# Patient Record
Sex: Male | Born: 1955 | Race: White | Hispanic: No | Marital: Single | Smoking: Former smoker
Health system: Southern US, Community
[De-identification: ages and names within clinical notes are randomized; demographics above are authoritative.]

## PROBLEM LIST (undated history)

## (undated) DIAGNOSIS — D239 Other benign neoplasm of skin, unspecified: Secondary | ICD-10-CM

## (undated) DIAGNOSIS — C4491 Basal cell carcinoma of skin, unspecified: Secondary | ICD-10-CM

## (undated) DIAGNOSIS — F419 Anxiety disorder, unspecified: Secondary | ICD-10-CM

## (undated) DIAGNOSIS — B1910 Unspecified viral hepatitis B without hepatic coma: Secondary | ICD-10-CM

## (undated) DIAGNOSIS — N189 Chronic kidney disease, unspecified: Secondary | ICD-10-CM

## (undated) DIAGNOSIS — F32A Depression, unspecified: Secondary | ICD-10-CM

## (undated) HISTORY — DX: Unspecified viral hepatitis B without hepatic coma: B19.10

## (undated) HISTORY — PX: COLON SURGERY: SHX602

## (undated) HISTORY — DX: Other benign neoplasm of skin, unspecified: D23.9

## (undated) HISTORY — DX: Anxiety disorder, unspecified: F41.9

## (undated) HISTORY — DX: Chronic kidney disease, unspecified: N18.9

## (undated) HISTORY — DX: Depression, unspecified: F32.A

## (undated) HISTORY — PX: SKIN BIOPSY: SHX1

---
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
## 2009-08-27 ENCOUNTER — Ambulatory Visit: Payer: Self-pay | Admitting: Gastroenterology

## 2009-08-27 DIAGNOSIS — R1011 Right upper quadrant pain: Secondary | ICD-10-CM | POA: Insufficient documentation

## 2009-08-27 DIAGNOSIS — B1910 Unspecified viral hepatitis B without hepatic coma: Secondary | ICD-10-CM

## 2009-08-27 DIAGNOSIS — R5383 Other fatigue: Secondary | ICD-10-CM

## 2009-08-27 DIAGNOSIS — R5381 Other malaise: Secondary | ICD-10-CM

## 2009-08-28 ENCOUNTER — Encounter: Payer: Self-pay | Admitting: Gastroenterology

## 2009-08-28 ENCOUNTER — Encounter: Payer: Self-pay | Admitting: Internal Medicine

## 2009-08-29 ENCOUNTER — Encounter: Payer: Self-pay | Admitting: Gastroenterology

## 2009-09-02 ENCOUNTER — Ambulatory Visit (HOSPITAL_COMMUNITY): Admission: RE | Admit: 2009-09-02 | Discharge: 2009-09-02 | Payer: Self-pay | Admitting: Internal Medicine

## 2009-09-15 ENCOUNTER — Encounter: Payer: Self-pay | Admitting: Gastroenterology

## 2009-09-18 LAB — CONVERTED CEMR LAB
AST: 13 units/L (ref 0–37)
Alkaline Phosphatase: 61 units/L (ref 39–117)
Bilirubin, Direct: 0.2 mg/dL (ref 0.0–0.3)
Eosinophils Absolute: 0.2 10*3/uL (ref 0.0–0.7)
HCT: 44.4 % (ref 39.0–52.0)
Hemoglobin: 15.4 g/dL (ref 13.0–17.0)
Hep B E Ag: NEGATIVE
Hepatitis B DNA: <29 IU/mL (ref <29)
Lymphocytes Relative: 33 % (ref 12–46)
Lymphs Abs: 2.3 10*3/uL (ref 0.7–4.0)
MCV: 95.5 fL (ref 78.0–100.0)
RBC: 4.65 M/uL (ref 4.22–5.81)
RDW: 14 % (ref 11.5–15.5)
TSH: 1.247 microintl units/mL (ref 0.350–4.500)
WBC: 7 10*3/uL (ref 4.0–10.5)

## 2009-09-22 ENCOUNTER — Encounter: Payer: Self-pay | Admitting: Internal Medicine

## 2009-09-24 ENCOUNTER — Encounter: Payer: Self-pay | Admitting: Gastroenterology

## 2009-10-02 ENCOUNTER — Encounter: Payer: Self-pay | Admitting: Gastroenterology

## 2009-10-09 ENCOUNTER — Ambulatory Visit: Payer: Self-pay | Admitting: Gastroenterology

## 2009-10-30 ENCOUNTER — Ambulatory Visit (HOSPITAL_COMMUNITY): Admission: RE | Admit: 2009-10-30 | Discharge: 2009-10-30 | Payer: Self-pay | Admitting: Gastroenterology

## 2009-11-06 ENCOUNTER — Ambulatory Visit: Payer: Self-pay | Admitting: Gastroenterology

## 2010-01-27 ENCOUNTER — Encounter: Payer: Self-pay | Admitting: Gastroenterology

## 2010-03-19 ENCOUNTER — Encounter (INDEPENDENT_AMBULATORY_CARE_PROVIDER_SITE_OTHER): Payer: Self-pay | Admitting: *Deleted

## 2010-03-31 ENCOUNTER — Encounter: Payer: Self-pay | Admitting: Internal Medicine

## 2010-04-06 ENCOUNTER — Ambulatory Visit (HOSPITAL_COMMUNITY): Admission: RE | Admit: 2010-04-06 | Discharge: 2010-04-06 | Payer: Self-pay | Admitting: Internal Medicine

## 2010-04-09 ENCOUNTER — Ambulatory Visit: Payer: Self-pay | Admitting: Gastroenterology

## 2010-07-22 ENCOUNTER — Encounter: Payer: Self-pay | Admitting: Gastroenterology

## 2011-01-19 NOTE — Letter (Signed)
Summary: Recall Radiology  [facility]  [address]   [place], [place] [address]   Phone: [phone]  Fax: [phone]    March 19, 2010  Kevin Davis [address] [place], [place]  [address] 1956-06-09   Dear Mr. PRICHARD,   Our office needs to get you scheduled for your repeat Ultrasound. Please give our office a call to schedule this.  You may call the office at your convenience at [phone].  Please ask for the Referral Coordinator to make arrangements for this to be scheduled.  You may have to leave a message on our voice mail.  We will return your call.  If for any reason you do not wish to schedule this, please advise the office.  Please do not neglect your health.   Thank you,    Ave Filter  [facility] Ph: [phone]   Fax: [phone]

## 2011-01-19 NOTE — Letter (Signed)
Summary: ABD U/S ORDER  ABD U/S ORDER   Imported By: Ave Filter 03/31/2010 08:21:37  _____________________________________________________________________  External Attachment:    Type:   Image     Comment:   External Document

## 2011-01-19 NOTE — Letter (Signed)
Summary: OFFICE NOTE/KAREN DOUGHERTY,NP  OFFICE NOTE/KAREN DOUGHERTY,NP   Imported By: Diana Eves 01/27/2010 16:11:29  _____________________________________________________________________  External Attachment:    Type:   Image     Comment:   External Document

## 2011-01-19 NOTE — Letter (Signed)
Summary: MEDICAL RECORDS  MEDICAL RECORDS   Imported By: Rexene Alberts 07/22/2010 14:09:29  _____________________________________________________________________  External Attachment:    Type:   Image     Comment:   External Document

## 2013-05-02 ENCOUNTER — Encounter: Payer: Self-pay | Admitting: Internal Medicine

## 2013-05-02 ENCOUNTER — Ambulatory Visit (INDEPENDENT_AMBULATORY_CARE_PROVIDER_SITE_OTHER): Payer: Self-pay | Admitting: Internal Medicine

## 2013-05-02 VITALS — BP 120/83 | HR 73 | Temp 98.1°F | Ht 71.0 in | Wt 194.0 lb

## 2013-05-02 DIAGNOSIS — Z7251 High risk heterosexual behavior: Secondary | ICD-10-CM

## 2013-05-02 DIAGNOSIS — B1910 Unspecified viral hepatitis B without hepatic coma: Secondary | ICD-10-CM

## 2013-05-02 DIAGNOSIS — Z23 Encounter for immunization: Secondary | ICD-10-CM

## 2013-05-02 LAB — COMPREHENSIVE METABOLIC PANEL
AST: 15 U/L (ref 0–37)
Alkaline Phosphatase: 76 U/L (ref 39–117)
Calcium: 9.9 mg/dL (ref 8.4–10.5)
Chloride: 107 mEq/L (ref 96–112)
Glucose, Bld: 94 mg/dL (ref 70–99)
Potassium: 4.3 mEq/L (ref 3.5–5.3)
Sodium: 139 mEq/L (ref 135–145)

## 2013-05-02 LAB — CBC
Hemoglobin: 16.8 g/dL (ref 13.0–17.0)
MCH: 33.5 pg (ref 26.0–34.0)
RBC: 5.02 MIL/uL (ref 4.22–5.81)
WBC: 7.7 10*3/uL (ref 4.0–10.5)

## 2013-05-02 NOTE — Progress Notes (Signed)
[facility] NOTE  RFV: establishing care for chronic hep B, last visit  Subjective:    Patient ID: Kevin Davis, male    DOB: 1956/09/30, 57 y.o.   MRN: [id number]  HPI 57yo Male with diagnosis of hep b infection in 2010. He reports getting annual visits, however last seen in 2011.  RF: having sex with men and women. 4-5 male partners in his lifetime.  He had unprotected sex, condom broke, as the receptive partner. Had anal itching fissure after sex occurred late dec thru jan 12; then roughly 2-3 wks later in Jan 22nd-March had episode = headache, light colored stool, itchy prickly palms and soles of feet, no rash., but possibly had a penile chancre. He did [facility] care at [facility], early HIV testing (at 30 days) which was negative, STD(RPR, GC, chlam) negative.Now with new male partner having protected sex.   Regarding hep b, Last followed up in 2011. First seen in 2010, given hep A vaccine #1 once  Previously taking herbs.(cinnamon, ginger, tumeric)  No jaundice, no n/v, no diarrhea, no fever/chills  Hx of chlamydia in the 1990s  No current outpatient prescriptions on file prior to visit.   No current facility-administered medications on file prior to visit.   Active Ambulatory Problems    Diagnosis Date Noted  . HEPATITIS B 08/27/2009  . FATIGUE 08/27/2009  . RUQ PAIN 08/27/2009   Resolved Ambulatory Problems    Diagnosis Date Noted  . No Resolved Ambulatory Problems   No Additional Past Medical History   Social hx: smoke <1/2PPD x 30, rare drinking.smokes marijuana, not working  Family hx: leukemia (sister), 1st cousin with NHL. Father died of alcoholism related issues at 55 yo.   Review of Systems   Constitutional: Negative for fever, chills, diaphoresis, activity change, appetite change, fatigue and unexpected weight change.  HENT: Negative for congestion, sore throat, rhinorrhea, sneezing, trouble swallowing and sinus pressure.  Eyes: Negative for  photophobia and visual disturbance.  Respiratory: Negative for cough, chest tightness, shortness of breath, wheezing and stridor.  Cardiovascular: Negative for chest pain, palpitations and leg swelling.  Gastrointestinal: Negative for nausea, vomiting, abdominal pain, diarrhea, constipation, blood in stool, abdominal distention and anal bleeding.  Genitourinary: Negative for dysuria, hematuria, flank pain and difficulty urinating.  Musculoskeletal: Negative for myalgias, back pain, joint swelling, arthralgias and gait problem.  Skin: Negative for color change, pallor, rash and wound.  Neurological: Negative for dizziness, tremors, weakness and light-headedness.  Hematological: Negative for adenopathy. Does not bruise/bleed easily.  Psychiatric/Behavioral: Negative for behavioral problems, confusion, sleep disturbance, dysphoric mood, decreased concentration and agitation.       Objective:   Physical Exam BP 120/83  Pulse 73  Temp(Src) 98.1 F (36.7 C) (Oral)  Ht 5\' 11"  (1.803 m)  Wt 194 lb (87.998 kg)  BMI 27.07 kg/m2 Physical Exam  Constitutional: He is oriented to person, place, and time. He appears well-developed and well-nourished. No distress.  HENT:  Mouth/Throat: Oropharynx is clear and moist. No oropharyngeal exudate.  Cardiovascular: Normal rate, regular rhythm and normal heart sounds. Exam reveals no gallop and no friction rub.  No murmur heard.  Pulmonary/Chest: Effort normal and breath sounds normal. No respiratory distress. He has no wheezes.  Abdominal: Soft. Bowel sounds are normal. He exhibits no distension. There is no tenderness.  Lymphadenopathy:  He has no cervical adenopathy.  Neurological: He is alert and oriented to person, place, and time.  Skin: Skin is warm and dry. No rash noted. No  erythema.  Psychiatric: He has a normal mood and affect. His behavior is normal.       Assessment & Plan:  Chronic hep B = will check hep B s Ag, hep B e ab, hep B delta,  Hep B viral load, cbc, and cmp and AFP. RUQ u/s - to look for hepatocellular carcinoma.  -will give 2nd dose of hep A today.  STD work-up = will repeat HIV testing, and RPR. GC and chlam have also been screened  rtc in 1 month in [place] clinic? / Otherwise rtc in 6-12 months for routine follow up

## 2013-05-03 LAB — HEPATITIS B SURFACE ANTIBODY,QUALITATIVE: Hep B S Ab: NONREACTIVE

## 2013-05-03 LAB — HEPATITIS B SURF AG CONFIRMATION: Hepatitis B Surf Ag Confirmation: POSITIVE — AB

## 2013-05-03 LAB — HEPATITIS B E ANTIBODY: Hepatitis Be Antibody: POSITIVE — AB

## 2013-05-03 LAB — HEPATITIS B SURFACE ANTIGEN

## 2013-10-25 ENCOUNTER — Other Ambulatory Visit: Payer: Self-pay

## 2013-10-30 ENCOUNTER — Encounter: Payer: Self-pay | Admitting: Internal Medicine

## 2013-11-06 ENCOUNTER — Ambulatory Visit: Payer: Self-pay | Admitting: Internal Medicine

## 2014-12-20 DIAGNOSIS — C4491 Basal cell carcinoma of skin, unspecified: Secondary | ICD-10-CM

## 2014-12-20 HISTORY — DX: Basal cell carcinoma of skin, unspecified: C44.91

## 2015-01-29 LAB — IFOBT (OCCULT BLOOD): IFOBT: POSITIVE

## 2015-03-06 ENCOUNTER — Encounter: Payer: Self-pay | Admitting: Internal Medicine

## 2015-04-07 ENCOUNTER — Ambulatory Visit: Payer: Self-pay | Admitting: Gastroenterology

## 2015-04-29 ENCOUNTER — Encounter: Payer: Self-pay | Admitting: Gastroenterology

## 2015-04-29 ENCOUNTER — Other Ambulatory Visit: Payer: Self-pay

## 2015-04-29 ENCOUNTER — Ambulatory Visit (INDEPENDENT_AMBULATORY_CARE_PROVIDER_SITE_OTHER): Payer: Self-pay | Admitting: Gastroenterology

## 2015-04-29 VITALS — BP 121/77 | HR 62 | Temp 97.8°F | Ht 71.0 in | Wt 188.0 lb

## 2015-04-29 DIAGNOSIS — R195 Other fecal abnormalities: Secondary | ICD-10-CM

## 2015-04-29 DIAGNOSIS — Z2251 Carrier of viral hepatitis B: Secondary | ICD-10-CM

## 2015-04-29 DIAGNOSIS — B181 Chronic viral hepatitis B without delta-agent: Secondary | ICD-10-CM

## 2015-04-29 NOTE — Progress Notes (Unsigned)
Pt wanted to change to 05/26/15 @ 945

## 2015-04-29 NOTE — Patient Instructions (Signed)
1. Colonoscopy as scheduled. See separate instructions.  2. I will review your records further and determine if you need further imaging of your liver at this time.

## 2015-04-29 NOTE — Progress Notes (Signed)
Primary Care Physician:  Montey Hora  Primary Gastroenterologist:  Garfield Cornea, MD   Chief Complaint  Patient presents with  . +IFOBT  . set up TCS    HPI:  Kevin Davis is a 59 y.o. male here for ifobt positive stool. [facility] by the [facility]. Seen back in 2010 for Hepatitis B, received letter from the [employer] after he donated blood.   ifobt positive couple of months ago. Routine testing. Hgb 17.5. Patient states he's previously been told that he may have internal hemorrhoids on the digital rectal exam the past. Denies any obvious rectal bleeding. No known family history of colon cancer. Maternal grandmother had a "ring around her intestines" which was cancerous at age 75 requiring resection. He does not believe it was colon cancer however. Bowel movements are regular. No abdominal pain, melena. Denies heartburn, vomiting, dysphagia, weight loss. No prior colonoscopy. Consumes liquor 2-3 times per week. Several drinks at a time.  Regarding Hepatitis B, patient was seen briefly at [facility]atitis Clinic by Dr. Adria Dill and most recently by Dr. Baxter Flattery with Infectious diseases in 2014. He failed to follow through with an U/S of liver at that time or return for follow up visit (he reports due to billing issues). He has history of having both male and male partners. Required STD testing in 2014 after unprotected sex, condom broke. Most recent labs 2014. He had a positive hepatitis B surface antigen, positive hepatitis Be antibody, undetectable hepatitis B DNA level, hepatitis B core total antibody positive, hepatitis B surface antibody nonreactive     No current outpatient prescriptions on file.   No current facility-administered medications for this visit.    Allergies as of 04/29/2015  . (No Known Allergies)    Past Medical History  Diagnosis Date  . Hepatitis B     Past Surgical History  Procedure Laterality Date  . None      Family History  Problem  Relation Age of Onset  . Acute myelogenous leukemia Sister   . Hepatitis B Sister   . Pancreatic cancer Paternal Grandfather     died age 75  . Lymphoma Cousin   . Colon cancer Neg Hx     History   Social History  . Marital Status: Single    Spouse Name: N/A  . Number of Children: N/A  . Years of Education: N/A   Occupational History  . unemployed    Social History Main Topics  . Smoking status: Current Some Day Smoker    Types: Cigarettes  . Smokeless tobacco: Not on file  . Alcohol Use: 0.0 oz/week    0 Standard drinks or equivalent per week     Comment: 2-3 times per week, several drinks/liquour  . Drug Use: No  . Sexual Activity:    Partners: Female, Male   Other Topics Concern  . Not on file   Social History Narrative      ROS:  General: Negative for anorexia, weight loss, fever, chills, fatigue, weakness. Eyes: Negative for vision changes.  ENT: Negative for hoarseness, difficulty swallowing , nasal congestion. CV: Negative for chest pain, angina, palpitations, dyspnea on exertion, peripheral edema.  Respiratory: Negative for dyspnea at rest, dyspnea on exertion, cough, sputum, wheezing.  GI: See history of present illness. GU:  Negative for dysuria, hematuria, urinary incontinence, urinary frequency, nocturnal urination.  MS: Negative for joint pain, low back pain.  Derm: Negative for rash or itching.  Neuro: Negative for weakness,  abnormal sensation, seizure, frequent headaches, memory loss, confusion.  Psych: Negative for anxiety, depression, suicidal ideation, hallucinations.  Endo: Negative for unusual weight change.  Heme: Negative for bruising or bleeding. Allergy: Negative for rash or hives.    Physical Examination:  BP 121/77 mmHg  Pulse 62  Temp(Src) 97.8 F (36.6 C)  Ht 5\' 11"  (1.803 m)  Wt 188 lb (85.276 kg)  BMI 26.23 kg/m2   General: Well-nourished, well-developed in no acute distress.  Head: Normocephalic, atraumatic.   Eyes:  Conjunctiva pink, no icterus. Mouth: Oropharyngeal mucosa moist and pink , no lesions erythema or exudate. Neck: Supple without thyromegaly, masses, or lymphadenopathy.  Lungs: Clear to auscultation bilaterally.  Heart: Regular rate and rhythm, no murmurs rubs or gallops.  Abdomen: Bowel sounds are normal, nontender, nondistended, no hepatosplenomegaly or masses, no abdominal bruits or    hernia , no rebound or guarding.   Rectal: deferred Extremities: No lower extremity edema. No clubbing or deformities.  Neuro: Alert and oriented x 4 , grossly normal neurologically.  Skin: Warm and dry, no rash or jaundice.   Psych: Alert and cooperative, normal mood and affect.  Labs: [facility] White blood cell count 10,100, hemoglobin 17.6, hematocrit 50.3, MCV 96.4, platelets 251,000, sodium 140, potassium 4.5, BUN 15, creatinine 1.17, total bilirubin 1.2, alkaline phosphatase 65, AST 16, ALT 12, albumin 4.3  Imaging Studies: No results found.

## 2015-04-30 ENCOUNTER — Encounter: Payer: Self-pay | Admitting: Gastroenterology

## 2015-04-30 NOTE — Progress Notes (Signed)
Tried to call pt- LMOM 

## 2015-04-30 NOTE — Progress Notes (Signed)
Please let patient know that I reviewed his records. Labs in 2014 consistent with chronic inactive hepatitis B. He should have periodic labs including LFTs, HBV DNA level. His LFTs are current.  He needs HBV DNA level, order has been entered. Should get abdominal ultrasound with elastography to check for any hepatic fibrosis. Needs before his colonoscopy because if any evidence of cirrhosis then we would do an EGD as well.

## 2015-04-30 NOTE — Assessment & Plan Note (Signed)
59 year old gentleman with history of positive I FOBT done as a screening maneuver. No prior colonoscopy. Recommend colonoscopy in near future. He consumes alcohol 2-3 times per week. Plan on Augmentin conscious sedation with Phenergan 25 mg IV 30 minutes before the procedure.  I have discussed the risks, alternatives, benefits with regards to but not limited to the risk of reaction to medication, bleeding, infection, perforation and the patient is agreeable to proceed. Written consent to be obtained.

## 2015-04-30 NOTE — Assessment & Plan Note (Signed)
History of chronic inactive hepatitis B based on serologies. He has had no follow-up in the past 2 years. LFTs currently normal. Should have periodic HBV DNA level checked, will offer now. Recommend ultrasound with elastography to evaluate liver for any fibrosis.

## 2015-05-01 NOTE — Progress Notes (Signed)
CC'ED TO PCP 

## 2015-05-02 ENCOUNTER — Other Ambulatory Visit: Payer: Self-pay

## 2015-05-02 DIAGNOSIS — K7400 Hepatic fibrosis, unspecified: Secondary | ICD-10-CM

## 2015-05-02 NOTE — Progress Notes (Signed)
Pt is aware. He will go have lab done either tomorrow or Monday. He said it was ok to schedule the U/S with elastography. Please schedule.

## 2015-05-02 NOTE — Progress Notes (Signed)
Pt had called back and left a message. I tried to call him back, NA, LMOM

## 2015-05-05 NOTE — Progress Notes (Signed)
Called and [facility] regarding [place] appt. 05/21/2015 @ 845am

## 2015-05-12 LAB — HEPATITIS B DNA, ULTRAQUANTITATIVE, PCR: HEPATITIS B DNA: NOT DETECTED [IU]/mL (ref <20)

## 2015-05-19 NOTE — Progress Notes (Signed)
Quick Note:  HBV DNA level remains undetectable c/w inactive chronic Hep B history. Recommend LFTs, HBV DNA PCR in 1 year.  Await u/s with elastography. ______

## 2015-05-20 ENCOUNTER — Encounter: Payer: Self-pay | Admitting: Gastroenterology

## 2015-05-21 ENCOUNTER — Ambulatory Visit (HOSPITAL_COMMUNITY)
Admission: RE | Admit: 2015-05-21 | Discharge: 2015-05-21 | Disposition: A | Discharge status: Home / Self Care | Payer: Self-pay | Source: Ambulatory Visit | Attending: Gastroenterology | Admitting: Gastroenterology

## 2015-05-21 DIAGNOSIS — K7400 Hepatic fibrosis, unspecified: Secondary | ICD-10-CM

## 2015-05-26 ENCOUNTER — Ambulatory Visit (HOSPITAL_COMMUNITY)
Admission: RE | Admit: 2015-05-26 | Discharge: 2015-05-26 | Disposition: A | Discharge status: Home / Self Care | Payer: Self-pay | Source: Ambulatory Visit | Attending: Internal Medicine | Admitting: Internal Medicine

## 2015-05-26 ENCOUNTER — Encounter (HOSPITAL_COMMUNITY): Payer: Self-pay | Admitting: *Deleted

## 2015-05-26 ENCOUNTER — Encounter (HOSPITAL_COMMUNITY)
Admission: RE | Disposition: A | Discharge status: Home / Self Care | Payer: Self-pay | Source: Ambulatory Visit | Attending: Internal Medicine

## 2015-05-26 DIAGNOSIS — Z860100 Personal history of colon polyps, unspecified: Secondary | ICD-10-CM | POA: Insufficient documentation

## 2015-05-26 DIAGNOSIS — D128 Benign neoplasm of rectum: Secondary | ICD-10-CM

## 2015-05-26 DIAGNOSIS — D124 Benign neoplasm of descending colon: Secondary | ICD-10-CM

## 2015-05-26 DIAGNOSIS — K621 Rectal polyp: Secondary | ICD-10-CM | POA: Insufficient documentation

## 2015-05-26 DIAGNOSIS — R195 Other fecal abnormalities: Secondary | ICD-10-CM

## 2015-05-26 DIAGNOSIS — F17210 Nicotine dependence, cigarettes, uncomplicated: Secondary | ICD-10-CM | POA: Insufficient documentation

## 2015-05-26 HISTORY — PX: COLONOSCOPY: SHX5424

## 2015-05-26 SURGERY — COLONOSCOPY
Anesthesia: Moderate Sedation

## 2015-05-26 MED ORDER — PROMETHAZINE HCL 25 MG/ML IJ SOLN
INTRAMUSCULAR | Status: AC
  Filled 2015-05-26: qty 1

## 2015-05-26 MED ORDER — ONDANSETRON HCL 4 MG/2ML IJ SOLN
INTRAMUSCULAR | Status: DC | PRN
  Administered 2015-05-26: 4 mg via INTRAVENOUS

## 2015-05-26 MED ORDER — MEPERIDINE HCL 100 MG/ML IJ SOLN
INTRAMUSCULAR | Status: AC
  Filled 2015-05-26: qty 2

## 2015-05-26 MED ORDER — PROMETHAZINE HCL 25 MG/ML IJ SOLN
25.0000 mg | Freq: Once | INTRAMUSCULAR | Status: AC
  Administered 2015-05-26: 25 mg via INTRAVENOUS

## 2015-05-26 MED ORDER — MIDAZOLAM HCL 5 MG/5ML IJ SOLN
INTRAMUSCULAR | Status: AC
  Filled 2015-05-26: qty 10

## 2015-05-26 MED ORDER — SIMETHICONE 40 MG/0.6ML PO SUSP
ORAL | Status: DC | PRN
  Administered 2015-05-26: 10:00:00

## 2015-05-26 MED ORDER — ONDANSETRON HCL 4 MG/2ML IJ SOLN
INTRAMUSCULAR | Status: AC
  Filled 2015-05-26: qty 2

## 2015-05-26 MED ORDER — SODIUM CHLORIDE 0.9 % IJ SOLN
INTRAMUSCULAR | Status: AC
  Filled 2015-05-26: qty 3

## 2015-05-26 MED ORDER — MEPERIDINE HCL 100 MG/ML IJ SOLN
INTRAMUSCULAR | Status: DC | PRN
  Administered 2015-05-26: 50 mg via INTRAVENOUS
  Administered 2015-05-26: 25 mg via INTRAVENOUS

## 2015-05-26 MED ORDER — MIDAZOLAM HCL 5 MG/5ML IJ SOLN
INTRAMUSCULAR | Status: DC | PRN
  Administered 2015-05-26 (×2): 1 mg via INTRAVENOUS
  Administered 2015-05-26: 2 mg via INTRAVENOUS

## 2015-05-26 MED ORDER — SODIUM CHLORIDE 0.9 % IV SOLN
INTRAVENOUS | Status: DC
  Administered 2015-05-26: 09:00:00 via INTRAVENOUS

## 2015-05-26 NOTE — Op Note (Signed)
[facility] [address] [place], [address]   COLONOSCOPY PROCEDURE REPORT  PATIENT: Kevin Davis, Kevin Davis  MR#: [id number] BIRTHDATE: 20-Feb-1956 , 24  yrs. old GENDER: male ENDOSCOPIST: R.  Garfield Cornea, MD FACP [facility] REFERRED KZ:[id number] [place], PA-C Carlyle Basques, M.D.  Marty Heck, M.D. PROCEDURE DATE:  2015/06/11 PROCEDURE:   Colonoscopy with snare polypectomy and biopsy INDICATIONS:Hemoccult-positive stool; no prior colonoscopy. MEDICATIONS: Versed 4 mg IV and Demerol 75 mg IV in divided doses. Phenergan 25 mg IV.  Zofran 4 mg IV. ASA CLASS:       Class II  CONSENT: The risks, benefits, alternatives and imponderables including but not limited to bleeding, perforation as well as the possibility of a missed lesion have been reviewed.  The potential for biopsy, lesion removal, etc. have also been discussed. Questions have been answered.  All parties agreeable.  Please see the history and physical in the medical record for more information.  DESCRIPTION OF PROCEDURE:   After the risks benefits and alternatives of the procedure were thoroughly explained, informed consent was obtained.  The digital rectal exam revealed no abnormalities of the rectum.   The EC-[id number] ([id number])  endoscope was introduced through the anus and advanced to the cecum, which was identified by both the appendix and ileocecal valve. No adverse events experienced.   The quality of the prep was adequate  The instrument was then slowly withdrawn as the colon was fully examined.      COLON FINDINGS: (1) diminutive polyp in the rectum 5 cm from the anal verge; remainder of the rectal mucosa appeared normal.  The patient had (1) 8 mm pedunculated, angry-appearing polyp in the mid descending segment; otherwise, the remainder of the colonic mucosa appeared normal.  The above-mentioned polyps were cold biopsied removed and hot snare removed, respectively.  Retroflexion was performed.  .  Withdrawal time=9 minutes 0 seconds.  The scope was withdrawn and the procedure completed. COMPLICATIONS: There were no immediate complications.  ENDOSCOPIC IMPRESSION: Rectal and colonic polyps?"removed as described above.  RECOMMENDATIONS: Follow-up pathology. Patient should follow-up with infectious disease and [place] [facility] as previously recommended.  eSigned:  R. Garfield Cornea, MD Rosalita Chessman [facility] 06-11-2015 10:46 AM   cc:  CPT CODES: ICD CODES:  The ICD and CPT codes recommended by this software are interpretations from the data that the clinical staff has captured with the software.  The verification of the translation of this report to the ICD and CPT codes and modifiers is the sole responsibility of the health care institution and practicing physician where this report was generated.  [employer] will not be held responsible for the validity of the ICD and CPT codes included on this report.  AMA assumes no liability for data contained or not contained herein. CPT is a Designer, television/film set of the [employer].  PATIENT NAME:  Kevin Davis, Kevin Davis MR#: [id number]

## 2015-05-26 NOTE — Discharge Instructions (Signed)
Colonoscopy Discharge Instructions  Read the instructions outlined below and refer to this sheet in the next few weeks. These discharge instructions provide you with general information on caring for yourself after you leave the hospital. Your doctor may also give you specific instructions. While your treatment has been planned according to the most current medical practices available, unavoidable complications occasionally occur. If you have any problems or questions after discharge, call Dr. Gala Romney at [phone]. ACTIVITY  You may resume your regular activity, but move at a slower pace for the next 24 hours.   Take frequent rest periods for the next 24 hours.   Walking will help get rid of the air and reduce the bloated feeling in your belly (abdomen).   No driving for 24 hours (because of the medicine (anesthesia) used during the test).    Do not sign any important legal documents or operate any machinery for 24 hours (because of the anesthesia used during the test).  NUTRITION  Drink plenty of fluids.   You may resume your normal diet as instructed by your doctor.   Begin with a light meal and progress to your normal diet. Heavy or fried foods are harder to digest and may make you feel sick to your stomach (nauseated).   Avoid alcoholic beverages for 24 hours or as instructed.  MEDICATIONS  You may resume your normal medications unless your doctor tells you otherwise.  WHAT YOU CAN EXPECT TODAY  Some feelings of bloating in the abdomen.   Passage of more gas than usual.   Spotting of blood in your stool or on the toilet paper.  IF YOU HAD POLYPS REMOVED DURING THE COLONOSCOPY:  No aspirin products for 7 days or as instructed.   No alcohol for 7 days or as instructed.   Eat a soft diet for the next 24 hours.  FINDING OUT THE RESULTS OF YOUR TEST Not all test results are available during your visit. If your test results are not back during the visit, make an appointment  with your caregiver to find out the results. Do not assume everything is normal if you have not heard from your caregiver or the medical facility. It is important for you to follow up on all of your test results.  SEEK IMMEDIATE MEDICAL ATTENTION IF:  You have more than a spotting of blood in your stool.   Your belly is swollen (abdominal distention).   You are nauseated or vomiting.   You have a temperature over 101.   You have abdominal pain or discomfort that is severe or gets worse throughout the day.   Colon Polyps Polyps are lumps of extra tissue growing inside the body. Polyps can grow in the large intestine (colon). Most colon polyps are noncancerous (benign). However, some colon polyps can become cancerous over time. Polyps that are larger than a pea may be harmful. To be safe, caregivers remove and test all polyps. CAUSES  Polyps form when mutations in the genes cause your cells to grow and divide even though no more tissue is needed. RISK FACTORS There are a number of risk factors that can increase your chances of getting colon polyps. They include:  Being older than 50 years.  Family history of colon polyps or colon cancer.  Long-term colon diseases, such as colitis or Crohn disease.  Being overweight.  Smoking.  Being inactive.  Drinking too much alcohol. SYMPTOMS  Most small polyps do not cause symptoms. If symptoms are present, they may include:  Blood in the stool. The stool may look dark red or black.  Constipation or diarrhea that lasts longer than 1 week. DIAGNOSIS People often do not know they have polyps until their caregiver finds them during a regular checkup. Your caregiver can use 4 tests to check for polyps:  Digital rectal exam. The caregiver wears gloves and feels inside the rectum. This test would find polyps only in the rectum.  Barium enema. The caregiver puts a liquid called barium into your rectum before taking X-rays of your colon. Barium  makes your colon look white. Polyps are dark, so they are easy to see in the X-ray pictures.  Sigmoidoscopy. A thin, flexible tube (sigmoidoscope) is placed into your rectum. The sigmoidoscope has a light and tiny camera in it. The caregiver uses the sigmoidoscope to look at the last third of your colon.  Colonoscopy. This test is like sigmoidoscopy, but the caregiver looks at the entire colon. This is the most common method for finding and removing polyps. TREATMENT  Any polyps will be removed during a sigmoidoscopy or colonoscopy. The polyps are then tested for cancer. PREVENTION  To help lower your risk of getting more colon polyps:  Eat plenty of fruits and vegetables. Avoid eating fatty foods.  Do not smoke.  Avoid drinking alcohol.  Exercise every day.  Lose weight if recommended by your caregiver.  Eat plenty of calcium and folate. Foods that are rich in calcium include milk, cheese, and broccoli. Foods that are rich in folate include chickpeas, kidney beans, and spinach. HOME CARE INSTRUCTIONS Keep all follow-up appointments as directed by your caregiver. You may need periodic exams to check for polyps. SEEK MEDICAL CARE IF: You notice bleeding during a bowel movement. Document Released: 09/01/2004 Document Revised: 02/28/2012 Document Reviewed: 02/15/2012 [facility] Patient Information 2015 [place], [place]. This information is not intended to replace advice given to you by your health care provider. Make sure you discuss any questions you have with your health care provider.

## 2015-05-26 NOTE — Interval H&P Note (Signed)
History and Physical Interval Note:  05/26/2015 10:16 AM  Coralie Carpen  has presented today for surgery, with the diagnosis of Heme positive stools  The various methods of treatment have been discussed with the patient and family. After consideration of risks, benefits and other options for treatment, the patient has consented to  Procedure(s) with comments: COLONOSCOPY (N/A) - 945 as a surgical intervention .  The patient's history has been reviewed, patient examined, no change in status, stable for surgery.  I have reviewed the patient's chart and labs.  Questions were answered to the patient's satisfaction.     Dur[place] Dittus  No change. Metavir f2/F3. No EGD today. Diagnostic colonoscopy for Hemoccult-positive stool (first ever colonoscopy).  The risks, benefits, limitations, alternatives and imponderables have been reviewed with the patient. Questions have been answered. All parties are agreeable.

## 2015-05-26 NOTE — H&P (View-Only) (Signed)
Primary Care Physician:  Montey Hora  Primary Gastroenterologist:  Garfield Cornea, MD   Chief Complaint  Patient presents with  . +IFOBT  . set up TCS    HPI:  Kevin Davis is a 59 y.o. male here for ifobt positive stool. Sent by the [facility]. Seen back in 2010 for Hepatitis B, received letter from the [employer] after he donated blood.   ifobt positive couple of months ago. Routine testing. Hgb 17.5. Patient states he's previously been told that he may have internal hemorrhoids on the digital rectal exam the past. Denies any obvious rectal bleeding. No known family history of colon cancer. Maternal grandmother had a "ring around her intestines" which was cancerous at age 40 requiring resection. He does not believe it was colon cancer however. Bowel movements are regular. No abdominal pain, melena. Denies heartburn, vomiting, dysphagia, weight loss. No prior colonoscopy. Consumes liquor 2-3 times per week. Several drinks at a time.  Regarding Hepatitis B, patient was [facility] by Dr. Adria Dill and most recently by Dr. Baxter Flattery with Infectious diseases in 2014. He failed to follow through with an U/S of liver at that time or return for follow up visit (he reports due to billing issues). He has history of having both male and male partners. Required STD testing in 2014 after unprotected sex, condom broke. Most recent labs 2014. He had a positive hepatitis B surface antigen, positive hepatitis Be antibody, undetectable hepatitis B DNA level, hepatitis B core total antibody positive, hepatitis B surface antibody nonreactive     No current outpatient prescriptions on file.   No current facility-administered medications for this visit.    Allergies as of 04/29/2015  . (No Known Allergies)    Past Medical History  Diagnosis Date  . Hepatitis B     Past Surgical History  Procedure Laterality Date  . None      Family History  Problem  Relation Age of Onset  . Acute myelogenous leukemia Sister   . Hepatitis B Sister   . Pancreatic cancer Paternal Grandfather     died age 27  . Lymphoma Cousin   . Colon cancer Neg Hx     History   Social History  . Marital Status: Single    Spouse Name: N/A  . Number of Children: N/A  . Years of Education: N/A   Occupational History  . unemployed    Social History Main Topics  . Smoking status: Current Some Day Smoker    Types: Cigarettes  . Smokeless tobacco: Not on file  . Alcohol Use: 0.0 oz/week    0 Standard drinks or equivalent per week     Comment: 2-3 times per week, several drinks/liquour  . Drug Use: No  . Sexual Activity:    Partners: Female, Male   Other Topics Concern  . Not on file   Social History Narrative      ROS:  General: Negative for anorexia, weight loss, fever, chills, fatigue, weakness. Eyes: Negative for vision changes.  ENT: Negative for hoarseness, difficulty swallowing , nasal congestion. CV: Negative for chest pain, angina, palpitations, dyspnea on exertion, peripheral edema.  Respiratory: Negative for dyspnea at rest, dyspnea on exertion, cough, sputum, wheezing.  GI: See history of present illness. GU:  Negative for dysuria, hematuria, urinary incontinence, urinary frequency, nocturnal urination.  MS: Negative for joint pain, low back pain.  Derm: Negative for rash or itching.  Neuro: Negative for weakness,  abnormal sensation, seizure, frequent headaches, memory loss, confusion.  Psych: Negative for anxiety, depression, suicidal ideation, hallucinations.  Endo: Negative for unusual weight change.  Heme: Negative for bruising or bleeding. Allergy: Negative for rash or hives.    Physical Examination:  BP 121/77 mmHg  Pulse 62  Temp(Src) 97.8 F (36.6 C)  Ht 5\' 11"  (1.803 m)  Wt 188 lb (85.276 kg)  BMI 26.23 kg/m2   General: Well-nourished, well-developed in no acute distress.  Head: Normocephalic, atraumatic.   Eyes:  Conjunctiva pink, no icterus. Mouth: Oropharyngeal mucosa moist and pink , no lesions erythema or exudate. Neck: Supple without thyromegaly, masses, or lymphadenopathy.  Lungs: Clear to auscultation bilaterally.  Heart: Regular rate and rhythm, no murmurs rubs or gallops.  Abdomen: Bowel sounds are normal, nontender, nondistended, no hepatosplenomegaly or masses, no abdominal bruits or    hernia , no rebound or guarding.   Rectal: deferred Extremities: No lower extremity edema. No clubbing or deformities.  Neuro: Alert and oriented x 4 , grossly normal neurologically.  Skin: Warm and dry, no rash or jaundice.   Psych: Alert and cooperative, normal mood and affect.  Labs: [facility] White blood cell count 10,100, hemoglobin 17.6, hematocrit 50.3, MCV 96.4, platelets 251,000, sodium 140, potassium 4.5, BUN 15, creatinine 1.17, total bilirubin 1.2, alkaline phosphatase 65, AST 16, ALT 12, albumin 4.3  Imaging Studies: No results found.

## 2015-05-27 ENCOUNTER — Encounter (HOSPITAL_COMMUNITY): Payer: Self-pay | Admitting: Internal Medicine

## 2015-05-28 NOTE — Progress Notes (Signed)
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 No evidence of cirrhosis.  Repeat abd u/s with elastography in one year.  We need to also check LFTs, Hep Be antigen, Hep Be antibody, HBV DNA in one year. ______

## 2015-05-30 ENCOUNTER — Other Ambulatory Visit: Payer: Self-pay | Admitting: Gastroenterology

## 2015-05-30 DIAGNOSIS — B181 Chronic viral hepatitis B without delta-agent: Secondary | ICD-10-CM

## 2015-06-11 ENCOUNTER — Encounter: Payer: Self-pay | Admitting: Internal Medicine

## 2015-07-07 ENCOUNTER — Telehealth: Payer: Self-pay | Admitting: General Practice

## 2015-07-07 NOTE — Telephone Encounter (Signed)
I called and left a message on Kevin Davis machine in regards to his [employer].

## 2015-07-08 NOTE — Telephone Encounter (Signed)
Patient called and stated he was upset because he received a bill from [employer].  I made him aware that he needed to speak with a financial counselor with [employer].

## 2015-09-11 ENCOUNTER — Encounter: Payer: Self-pay | Admitting: Gastroenterology

## 2016-02-18 ENCOUNTER — Other Ambulatory Visit: Payer: Self-pay | Admitting: Physician Assistant

## 2016-02-18 ENCOUNTER — Ambulatory Visit: Payer: Self-pay | Admitting: Physician Assistant

## 2016-02-18 LAB — LIPID PANEL
CHOL/HDL RATIO: 3.1 ratio (ref <=5.0)
Cholesterol: 118 mg/dL — ABNORMAL LOW (ref 125–200)
HDL: 38 mg/dL — ABNORMAL LOW (ref >=40)
LDL Cholesterol: 57 mg/dL (ref <130)
Triglycerides: 114 mg/dL (ref <150)
VLDL: 23 mg/dL (ref <30)

## 2016-02-18 LAB — COMPREHENSIVE METABOLIC PANEL
ALK PHOS: 61 U/L (ref 40–115)
ALT: 12 U/L (ref 9–46)
AST: 17 U/L (ref 10–35)
Albumin: 4.2 g/dL (ref 3.6–5.1)
BUN: 13 mg/dL (ref 7–25)
CO2: 22 mmol/L (ref 20–31)
Calcium: 9 mg/dL (ref 8.6–10.3)
Chloride: 107 mmol/L (ref 98–110)
Creat: 1.17 mg/dL (ref 0.70–1.33)
GLUCOSE: 84 mg/dL (ref 65–99)
POTASSIUM: 4.2 mmol/L (ref 3.5–5.3)
Sodium: 142 mmol/L (ref 135–146)
Total Bilirubin: 0.8 mg/dL (ref 0.2–1.2)
Total Protein: 6.9 g/dL (ref 6.1–8.1)

## 2016-02-18 LAB — CBC
HCT: 48.6 % (ref 39.0–52.0)
Hemoglobin: 16.5 g/dL (ref 13.0–17.0)
MCH: 33.4 pg (ref 26.0–34.0)
MCHC: 34 g/dL (ref 30.0–36.0)
MCV: 98.4 fL (ref 78.0–100.0)
MPV: 10.7 fL (ref 8.6–12.4)
Platelets: 239 10*3/uL (ref 150–400)
RBC: 4.94 MIL/uL (ref 4.22–5.81)
RDW: 14 % (ref 11.5–15.5)
WBC: 6.9 10*3/uL (ref 4.0–10.5)

## 2016-02-24 ENCOUNTER — Ambulatory Visit: Payer: Self-pay | Admitting: Physician Assistant

## 2016-02-24 ENCOUNTER — Encounter: Payer: Self-pay | Admitting: Physician Assistant

## 2016-02-24 VITALS — BP 114/70 | HR 63 | Temp 97.9°F | Ht 71.0 in | Wt 195.4 lb

## 2016-02-24 DIAGNOSIS — F17210 Nicotine dependence, cigarettes, uncomplicated: Secondary | ICD-10-CM | POA: Insufficient documentation

## 2016-02-24 NOTE — Progress Notes (Signed)
BP 114/70 mmHg  Pulse 63  Temp(Src) 97.9 F (36.6 C)  Ht 5\' 11"  (1.803 m)  Wt 195 lb 6.4 oz (88.633 kg)  BMI 27.26 kg/m2  SpO2 99%   Subjective:    Patient ID: Kevin Davis, male    DOB: [birth year], 58 y.o.   MRN: [id number]  HPI: Kevin Davis is a 60 y.o. male presenting on 02/24/2016 for Follow-up   HPI   Pt is feeling well today and has no complaints  Relevant past medical, surgical, family and social history reviewed and updated as indicated. Interim medical history since our last visit reviewed. Allergies and medications reviewed and updated.   Current outpatient prescriptions:  .  Ascorbic Acid (VITAMIN C PO), Take 0.5 tablets by mouth daily. , Disp: , Rfl:  .  B Complex Vitamins (VITAMIN B COMPLEX PO), Take 0.5 tablets by mouth daily., Disp: , Rfl:  .  Cholecalciferol (VITAMIN D PO), Take 0.5 tablets by mouth daily., Disp: , Rfl:  .  Cod Liver Oil CAPS, Take 1 capsule by mouth daily., Disp: , Rfl:  .  Green Tea 315 MG CAPS, Take 1 capsule by mouth daily., Disp: , Rfl:  .  Multiple Vitamins-Minerals (ZINC PO), Take 0.5 tablets by mouth daily., Disp: , Rfl:  .  SELENIUM PO, Take 0.5 tablets by mouth daily. Reported on 02/24/2016, Disp: , Rfl:    Review of Systems  Constitutional: Negative for fever, chills, diaphoresis, appetite change, fatigue and unexpected weight change.  HENT: Positive for congestion and dental problem. Negative for drooling, ear pain, facial swelling, hearing loss, mouth sores, sneezing, sore throat, trouble swallowing and voice change.   Eyes: Negative for pain, discharge, redness, itching and visual disturbance.  Respiratory: Positive for cough and wheezing. Negative for choking and shortness of breath.   Cardiovascular: Negative for chest pain, palpitations and leg swelling.  Gastrointestinal: Negative for vomiting, abdominal pain, diarrhea, constipation and blood in stool.  Endocrine: Negative for cold intolerance, heat intolerance and  polydipsia.  Genitourinary: Negative for dysuria, hematuria and decreased urine volume.  Musculoskeletal: Negative for back pain, arthralgias and gait problem.  Skin: Negative for rash.  Allergic/Immunologic: Negative for environmental allergies.  Neurological: Negative for seizures, syncope, light-headedness and headaches.  Hematological: Negative for adenopathy.  Psychiatric/Behavioral: Negative for suicidal ideas, dysphoric mood and agitation. The patient is not nervous/anxious.     Per HPI unless specifically indicated above     Objective:    BP 114/70 mmHg  Pulse 63  Temp(Src) 97.9 F (36.6 C)  Ht 5\' 11"  (1.803 m)  Wt 195 lb 6.4 oz (88.633 kg)  BMI 27.26 kg/m2  SpO2 99%  Wt Readings from Last 3 Encounters:  02/24/16 195 lb 6.4 oz (88.633 kg)  05/26/15 188 lb (85.276 kg)  04/29/15 188 lb (85.276 kg)    Physical Exam  Constitutional: He is oriented to person, place, and time. He appears well-developed and well-nourished.  HENT:  Head: Normocephalic and atraumatic.  Neck: Neck supple.  Cardiovascular: Normal rate and regular rhythm.   Pulmonary/Chest: Effort normal and breath sounds normal. He has no wheezes.  Abdominal: Soft. Bowel sounds are normal. There is no hepatosplenomegaly. There is no tenderness.  Musculoskeletal: He exhibits no edema.  Lymphadenopathy:    He has no cervical adenopathy.  Neurological: He is alert and oriented to person, place, and time.  Skin: Skin is warm and dry.  Psychiatric: He has a normal mood and affect. His behavior is normal.  Vitals  reviewed.   Results for orders placed or performed in visit on 02/18/16  CBC  Result Value Ref Range   WBC 6.9 4.0 - 10.5 K/uL   RBC 4.94 4.22 - 5.81 MIL/uL   Hemoglobin 16.5 13.0 - 17.0 g/dL   HCT 48.6 39.0 - 52.0 %   MCV 98.4 78.0 - 100.0 fL   MCH 33.4 26.0 - 34.0 pg   MCHC 34.0 30.0 - 36.0 g/dL   RDW 14.0 11.5 - 15.5 %   Platelets 239 150 - 400 K/uL   MPV 10.7 8.6 - 12.4 fL  Comprehensive  metabolic panel  Result Value Ref Range   Sodium 142 135 - 146 mmol/L   Potassium 4.2 3.5 - 5.3 mmol/L   Chloride 107 98 - 110 mmol/L   CO2 22 20 - 31 mmol/L   Glucose, Bld 84 65 - 99 mg/dL   BUN 13 7 - 25 mg/dL   Creat 1.17 0.70 - 1.33 mg/dL   Total Bilirubin 0.8 0.2 - 1.2 mg/dL   Alkaline Phosphatase 61 40 - 115 U/L   AST 17 10 - 35 U/L   ALT 12 9 - 46 U/L   Total Protein 6.9 6.1 - 8.1 g/dL   Albumin 4.2 3.6 - 5.1 g/dL   Calcium 9.0 8.6 - 10.3 mg/dL  Lipid panel  Result Value Ref Range   Cholesterol 118 (L) 125 - 200 mg/dL   Triglycerides 114 <150 mg/dL   HDL 38 (L) >=40 mg/dL   Total CHOL/HDL Ratio 3.1 <=5.0 Ratio   VLDL 23 <30 mg/dL   LDL Cholesterol 57 <130 mg/dL      Assessment & Plan:   Encounter Diagnosis  Name Primary?  . Cigarette nicotine dependence without complication Yes    -reviewed labs with pt -counseled on smoking cessation -f/u one year.  RTO sooner prn

## 2016-02-24 NOTE — Patient Instructions (Signed)
Smoking Cessation, Tips for Success If you are ready to quit smoking, congratulations! You have chosen to help yourself be healthier. Cigarettes bring nicotine, tar, carbon monoxide, and other irritants into your body. Your lungs, heart, and blood vessels will be able to work better without these poisons. There are many different ways to quit smoking. Nicotine gum, nicotine patches, a nicotine inhaler, or nicotine nasal spray can help with physical craving. Hypnosis, support groups, and medicines help break the habit of smoking. WHAT THINGS CAN I DO TO MAKE QUITTING EASIER?  Here are some tips to help you quit for good:  Pick a date when you will quit smoking completely. Tell all of your friends and family about your plan to quit on that date.  Do not try to slowly cut down on the number of cigarettes you are smoking. Pick a quit date and quit smoking completely starting on that day.  Throw away all cigarettes.   Clean and remove all ashtrays from your home, work, and car.  On a card, write down your reasons for quitting. Carry the card with you and read it when you get the urge to smoke.  Cleanse your body of nicotine. Drink enough water and fluids to keep your urine clear or pale yellow. Do this after quitting to flush the nicotine from your body.  Learn to predict your moods. Do not let a bad situation be your excuse to have a cigarette. Some situations in your life might tempt you into wanting a cigarette.  Never have "just one" cigarette. It leads to wanting another and another. Remind yourself of your decision to quit.  Change habits associated with smoking. If you smoked while driving or when feeling stressed, try other activities to replace smoking. Stand up when drinking your coffee. Brush your teeth after eating. Sit in a different chair when you read the paper. Avoid alcohol while trying to quit, and try to drink fewer caffeinated beverages. Alcohol and caffeine may urge you to  smoke.  Avoid foods and drinks that can trigger a desire to smoke, such as sugary or spicy foods and alcohol.  Ask people who smoke not to smoke around you.  Have something planned to do right after eating or having a cup of coffee. For example, plan to take a walk or exercise.  Try a relaxation exercise to calm you down and decrease your stress. Remember, you may be tense and nervous for the first 2 weeks after you quit, but this will pass.  Find new activities to keep your hands busy. Play with a pen, coin, or rubber band. Doodle or draw things on paper.  Brush your teeth right after eating. This will help cut down on the craving for the taste of tobacco after meals. You can also try mouthwash.   Use oral substitutes in place of cigarettes. Try using lemon drops, carrots, cinnamon sticks, or chewing gum. Keep them handy so they are available when you have the urge to smoke.  When you have the urge to smoke, try deep breathing.  Designate your home as a nonsmoking area.  If you are a heavy smoker, ask your health care provider about a prescription for nicotine chewing gum. It can ease your withdrawal from nicotine.  Reward yourself. Set aside the cigarette money you save and buy yourself something nice.  Look for support from others. Join a support group or smoking cessation program. Ask someone at home or at work to help you with your plan   to quit smoking.  Always ask yourself, "Do I need this cigarette or is this just a reflex?" Tell yourself, "Today, I choose not to smoke," or "I do not want to smoke." You are reminding yourself of your decision to quit.  Do not replace cigarette smoking with electronic cigarettes (commonly called e-cigarettes). The safety of e-cigarettes is unknown, and some may contain harmful chemicals.  If you relapse, do not give up! Plan ahead and think about what you will do the next time you get the urge to smoke. HOW WILL I FEEL WHEN I QUIT SMOKING? You  may have symptoms of withdrawal because your body is used to nicotine (the addictive substance in cigarettes). You may crave cigarettes, be irritable, feel very hungry, cough often, get headaches, or have difficulty concentrating. The withdrawal symptoms are only temporary. They are strongest when you first quit but will go away within 10-14 days. When withdrawal symptoms occur, stay in control. Think about your reasons for quitting. Remind yourself that these are signs that your body is healing and getting used to being without cigarettes. Remember that withdrawal symptoms are easier to treat than the major diseases that smoking can cause.  Even after the withdrawal is over, expect periodic urges to smoke. However, these cravings are generally short lived and will go away whether you smoke or not. Do not smoke! WHAT RESOURCES ARE AVAILABLE TO HELP ME QUIT SMOKING? Your health care provider can direct you to community resources or hospitals for support, which may include:  Group support.  Education.  Hypnosis.  Therapy.   This information is not intended to replace advice given to you by your health care provider. Make sure you discuss any questions you have with your health care provider.   Document Released: 09/03/2004 Document Revised: 12/27/2014 Document Reviewed: 05/24/2013 Elsevier Interactive Patient Education 2016 Elsevier Inc.  

## 2016-05-05 ENCOUNTER — Telehealth: Payer: Self-pay | Admitting: Internal Medicine

## 2016-05-05 NOTE — Telephone Encounter (Signed)
RECALL FOR ULTRASOUND WITH ELASTO °

## 2016-05-05 NOTE — Telephone Encounter (Signed)
Mailed letter °

## 2016-05-10 ENCOUNTER — Other Ambulatory Visit: Payer: Self-pay

## 2016-05-10 DIAGNOSIS — B181 Chronic viral hepatitis B without delta-agent: Secondary | ICD-10-CM

## 2016-05-12 NOTE — Telephone Encounter (Signed)
Pt called office and LMOM here that he would not be doing any yearly testing due to his lack of insurance. States he is planning on pushing all tests out til the end of the year. Will contact us when he is ready.

## 2016-05-12 NOTE — Telephone Encounter (Signed)
Noted  

## 2016-11-17 ENCOUNTER — Other Ambulatory Visit: Payer: Self-pay | Admitting: Physician Assistant

## 2016-11-17 DIAGNOSIS — L821 Other seborrheic keratosis: Secondary | ICD-10-CM

## 2016-11-17 DIAGNOSIS — L989 Disorder of the skin and subcutaneous tissue, unspecified: Secondary | ICD-10-CM

## 2017-02-23 ENCOUNTER — Ambulatory Visit: Payer: Self-pay | Admitting: Physician Assistant

## 2017-02-23 ENCOUNTER — Encounter: Payer: Self-pay | Admitting: Physician Assistant

## 2017-02-23 VITALS — BP 114/72 | HR 73 | Temp 97.3°F | Ht 71.0 in | Wt 198.0 lb

## 2017-02-23 DIAGNOSIS — Z125 Encounter for screening for malignant neoplasm of prostate: Secondary | ICD-10-CM

## 2017-02-23 DIAGNOSIS — F17210 Nicotine dependence, cigarettes, uncomplicated: Secondary | ICD-10-CM

## 2017-02-23 NOTE — Progress Notes (Signed)
BP 114/72 (BP Location: Left Arm, Patient Position: Sitting, Cuff Size: Large)   Pulse 73   Temp 97.3 F (36.3 C) (Other (Comment))   Ht 5\' 11"  (1.803 m)   Wt 198 lb (89.8 kg)   SpO2 99%   BMI 27.62 kg/m    Subjective:    Patient ID: Kevin Davis, male    DOB: 01/11/1956, 61 y.o.   MRN: [id number]  HPI: Kevin Davis is a 61 y.o. male presenting on 02/23/2017 for Follow-up (states has been working out at gym 3 times a week)   HPI  Chief Complaint  Patient presents with  . Follow-up    states has been working out at gym 3 times a week    Pt is still smoking 1 ppd  Pt is feeling well  Relevant past medical, surgical, family and social history reviewed and updated as indicated. Interim medical history since our last visit reviewed. Allergies and medications reviewed and updated.   Current Outpatient Prescriptions:  .  Ascorbic Acid (VITAMIN C PO), Take 0.5 tablets by mouth daily. , Disp: , Rfl:  .  B Complex Vitamins (VITAMIN B COMPLEX PO), Take 0.5 tablets by mouth daily., Disp: , Rfl:  .  Cholecalciferol (VITAMIN D PO), Take 0.5 tablets by mouth daily., Disp: , Rfl:  .  Cod Liver Oil CAPS, Take 1 capsule by mouth daily., Disp: , Rfl:  .  Green Tea 315 MG CAPS, Take 1 capsule by mouth daily., Disp: , Rfl:  .  Multiple Vitamins-Minerals (ZINC PO), Take 0.5 tablets by mouth daily., Disp: , Rfl:  .  SELENIUM PO, Take 0.5 tablets by mouth daily. Reported on 02/24/2016, Disp: , Rfl:   Review of Systems  Constitutional: Positive for appetite change. Negative for chills, diaphoresis, fatigue, fever and unexpected weight change.  HENT: Negative for congestion, dental problem, drooling, ear pain, facial swelling, hearing loss, mouth sores, sneezing, sore throat, trouble swallowing and voice change.   Eyes: Negative for pain, discharge, redness, itching and visual disturbance.  Respiratory: Negative for cough, choking, shortness of breath and wheezing.   Cardiovascular: Negative  for chest pain, palpitations and leg swelling.  Gastrointestinal: Negative for abdominal pain, blood in stool, constipation, diarrhea and vomiting.  Endocrine: Negative for cold intolerance, heat intolerance and polydipsia.  Genitourinary: Negative for decreased urine volume, dysuria and hematuria.  Musculoskeletal: Negative for arthralgias, back pain and gait problem.  Skin: Negative for rash.  Allergic/Immunologic: Negative for environmental allergies.  Neurological: Negative for seizures, syncope, light-headedness and headaches.  Hematological: Negative for adenopathy.  Psychiatric/Behavioral: Negative for agitation, dysphoric mood and suicidal ideas. The patient is not nervous/anxious.     Per HPI unless specifically indicated above     Objective:    BP 114/72 (BP Location: Left Arm, Patient Position: Sitting, Cuff Size: Large)   Pulse 73   Temp 97.3 F (36.3 C) (Other (Comment))   Ht 5\' 11"  (1.803 m)   Wt 198 lb (89.8 kg)   SpO2 99%   BMI 27.62 kg/m   Wt Readings from Last 3 Encounters:  02/23/17 198 lb (89.8 kg)  02/24/16 195 lb 6.4 oz (88.6 kg)  05/26/15 188 lb (85.3 kg)    Physical Exam  Constitutional: He is oriented to person, place, and time. He appears well-developed and well-nourished.  HENT:  Head: Normocephalic and atraumatic.  Eyes: Conjunctivae and EOM are normal. Pupils are equal, round, and reactive to light.  Neck: Neck supple.  Cardiovascular: Normal rate and  regular rhythm.   Pulmonary/Chest: Effort normal and breath sounds normal. He has no wheezes.  Abdominal: Soft. Bowel sounds are normal. There is no hepatosplenomegaly. There is no tenderness.  Musculoskeletal: He exhibits no edema.  Lymphadenopathy:    He has no cervical adenopathy.  Neurological: He is alert and oriented to person, place, and time.  Skin: Skin is warm and dry.  Psychiatric: He has a normal mood and affect. His behavior is normal.  Vitals reviewed.   Results for orders  placed or performed in visit on 02/18/16  CBC  Result Value Ref Range   WBC 6.9 4.0 - 10.5 K/uL   RBC 4.94 4.22 - 5.81 MIL/uL   Hemoglobin 16.5 13.0 - 17.0 g/dL   HCT 48.6 39.0 - 52.0 %   MCV 98.4 78.0 - 100.0 fL   MCH 33.4 26.0 - 34.0 pg   MCHC 34.0 30.0 - 36.0 g/dL   RDW 14.0 11.5 - 15.5 %   Platelets 239 150 - 400 K/uL   MPV 10.7 8.6 - 12.4 fL  Comprehensive metabolic panel  Result Value Ref Range   Sodium 142 135 - 146 mmol/L   Potassium 4.2 3.5 - 5.3 mmol/L   Chloride 107 98 - 110 mmol/L   CO2 22 20 - 31 mmol/L   Glucose, Bld 84 65 - 99 mg/dL   BUN 13 7 - 25 mg/dL   Creat 1.17 0.70 - 1.33 mg/dL   Total Bilirubin 0.8 0.2 - 1.2 mg/dL   Alkaline Phosphatase 61 40 - 115 U/L   AST 17 10 - 35 U/L   ALT 12 9 - 46 U/L   Total Protein 6.9 6.1 - 8.1 g/dL   Albumin 4.2 3.6 - 5.1 g/dL   Calcium 9.0 8.6 - 10.3 mg/dL  Lipid panel  Result Value Ref Range   Cholesterol 118 (L) 125 - 200 mg/dL   Triglycerides 114 <150 mg/dL   HDL 38 (L) >=40 mg/dL   Total CHOL/HDL Ratio 3.1 <=5.0 Ratio   VLDL 23 <30 mg/dL   LDL Cholesterol 57 <130 mg/dL      Assessment & Plan:    Encounter Diagnoses  Name Primary?  . Cigarette nicotine dependence without complication Yes  . Screening for prostate cancer     -Check PSA.  No need check other labs- will check lipids every other year -counseled smoking cessation -follow up one year.  RTO sooner prn

## 2017-02-23 NOTE — Patient Instructions (Signed)
Steps to Quit Smoking Smoking tobacco can be harmful to your health and can affect almost every organ in your body. Smoking puts you, and those around you, at risk for developing many serious chronic diseases. Quitting smoking is difficult, but it is one of the best things that you can do for your health. It is never too late to quit. What are the benefits of quitting smoking? When you quit smoking, you lower your risk of developing serious diseases and conditions, such as:  Lung cancer or lung disease, such as COPD.  Heart disease.  Stroke.  Heart attack.  Infertility.  Osteoporosis and bone fractures.  Additionally, symptoms such as coughing, wheezing, and shortness of breath may get better when you quit. You may also find that you get sick less often because your body is stronger at fighting off colds and infections. If you are pregnant, quitting smoking can help to reduce your chances of having a baby of low birth weight. How do I get ready to quit? When you decide to quit smoking, create a plan to make sure that you are successful. Before you quit:  Pick a date to quit. Set a date within the next two weeks to give you time to prepare.  Write down the reasons why you are quitting. Keep this list in places where you will see it often, such as on your bathroom mirror or in your car or wallet.  Identify the people, places, things, and activities that make you want to smoke (triggers) and avoid them. Make sure to take these actions: ? Throw away all cigarettes at home, at work, and in your car. ? Throw away smoking accessories, such as ashtrays and lighters. ? Clean your car and make sure to empty the ashtray. ? Clean your home, including curtains and carpets.  Tell your family, friends, and coworkers that you are quitting. Support from your loved ones can make quitting easier.  Talk with your health care provider about your options for quitting smoking.  Find out what treatment  options are covered by your health insurance.  What strategies can I use to quit smoking? Talk with your healthcare provider about different strategies to quit smoking. Some strategies include:  Quitting smoking altogether instead of gradually lessening how much you smoke over a period of time. Research shows that quitting "cold turkey" is more successful than gradually quitting.  Attending in-person counseling to help you build problem-solving skills. You are more likely to have success in quitting if you attend several counseling sessions. Even short sessions of 10 minutes can be effective.  Finding resources and support systems that can help you to quit smoking and remain smoke-free after you quit. These resources are most helpful when you use them often. They can include: ? Online chats with a counselor. ? Telephone quitlines. ? Printed self-help materials. ? Support groups or group counseling. ? Text messaging programs. ? Mobile phone applications.  Taking medicines to help you quit smoking. (If you are pregnant or breastfeeding, talk with your health care provider first.) Some medicines contain nicotine and some do not. Both types of medicines help with cravings, but the medicines that include nicotine help to relieve withdrawal symptoms. Your health care provider may recommend: ? Nicotine patches, gum, or lozenges. ? Nicotine inhalers or sprays. ? Non-nicotine medicine that is taken by mouth.  Talk with your health care provider about combining strategies, such as taking medicines while you are also receiving in-person counseling. Using these two strategies together   makes you more likely to succeed in quitting than if you used either strategy on its own. If you are pregnant or breastfeeding, talk with your health care provider about finding counseling or other support strategies to quit smoking. Do not take medicine to help you quit smoking unless told to do so by your health care  provider. What things can I do to make it easier to quit? Quitting smoking might feel overwhelming at first, but there is a lot that you can do to make it easier. Take these important actions:  Reach out to your family and friends and ask that they support and encourage you during this time. Call telephone quitlines, reach out to support groups, or work with a counselor for support.  Ask people who smoke to avoid smoking around you.  Avoid places that trigger you to smoke, such as bars, parties, or smoke-break areas at work.  Spend time around people who do not smoke.  Lessen stress in your life, because stress can be a smoking trigger for some people. To lessen stress, try: ? Exercising regularly. ? Deep-breathing exercises. ? Yoga. ? Meditating. ? Performing a body scan. This involves closing your eyes, scanning your body from head to toe, and noticing which parts of your body are particularly tense. Purposefully relax the muscles in those areas.  Download or purchase mobile phone or tablet apps (applications) that can help you stick to your quit plan by providing reminders, tips, and encouragement. There are many free apps, such as QuitGuide from the CDC (Centers for Disease Control and Prevention). You can find other support for quitting smoking (smoking cessation) through smokefree.gov and other websites.  How will I feel when I quit smoking? Within the first 24 hours of quitting smoking, you may start to feel some withdrawal symptoms. These symptoms are usually most noticeable 2-3 days after quitting, but they usually do not last beyond 2-3 weeks. Changes or symptoms that you might experience include:  Mood swings.  Restlessness, anxiety, or irritation.  Difficulty concentrating.  Dizziness.  Strong cravings for sugary foods in addition to nicotine.  Mild weight gain.  Constipation.  Nausea.  Coughing or a sore throat.  Changes in how your medicines work in your  body.  A depressed mood.  Difficulty sleeping (insomnia).  After the first 2-3 weeks of quitting, you may start to notice more positive results, such as:  Improved sense of smell and taste.  Decreased coughing and sore throat.  Slower heart rate.  Lower blood pressure.  Clearer skin.  The ability to breathe more easily.  Fewer sick days.  Quitting smoking is very challenging for most people. Do not get discouraged if you are not successful the first time. Some people need to make many attempts to quit before they achieve long-term success. Do your best to stick to your quit plan, and talk with your health care provider if you have any questions or concerns. This information is not intended to replace advice given to you by your health care provider. Make sure you discuss any questions you have with your health care provider. Document Released: 11/30/2001 Document Revised: 08/03/2016 Document Reviewed: 04/22/2015 Elsevier Interactive Patient Education  2017 Elsevier Inc.  

## 2017-02-24 LAB — PSA: PSA: 1.8 ng/mL (ref <=4.0)

## 2018-02-22 ENCOUNTER — Ambulatory Visit: Payer: Self-pay | Admitting: Physician Assistant

## 2018-02-22 ENCOUNTER — Encounter: Payer: Self-pay | Admitting: Physician Assistant

## 2018-02-22 ENCOUNTER — Other Ambulatory Visit (HOSPITAL_COMMUNITY)
Admission: RE | Admit: 2018-02-22 | Discharge: 2018-02-22 | Disposition: A | Discharge status: Home / Self Care | Payer: Self-pay | Source: Ambulatory Visit | Attending: Physician Assistant | Admitting: Physician Assistant

## 2018-02-22 VITALS — BP 106/70 | HR 55 | Temp 97.2°F | Ht 71.0 in | Wt 185.2 lb

## 2018-02-22 DIAGNOSIS — Z0000 Encounter for general adult medical examination without abnormal findings: Secondary | ICD-10-CM

## 2018-02-22 DIAGNOSIS — F17210 Nicotine dependence, cigarettes, uncomplicated: Secondary | ICD-10-CM

## 2018-02-22 DIAGNOSIS — Z125 Encounter for screening for malignant neoplasm of prostate: Secondary | ICD-10-CM

## 2018-02-22 LAB — PSA: PROSTATIC SPECIFIC ANTIGEN: 1.72 ng/mL (ref 0.00–4.00)

## 2018-02-22 NOTE — Patient Instructions (Signed)
Coping with Quitting Smoking Quitting smoking is a physical and mental challenge. You will face cravings, withdrawal symptoms, and temptation. Before quitting, work with your health care provider to make a plan that can help you cope. Preparation can help you quit and keep you from giving in. How can I cope with cravings? Cravings usually last for 5-10 minutes. If you get through it, the craving will pass. Consider taking the following actions to help you cope with cravings:  Keep your mouth busy: ? Chew sugar-free gum. ? Suck on hard candies or a straw. ? Brush your teeth.  Keep your hands and body busy: ? Immediately change to a different activity when you feel a craving. ? Squeeze or play with a ball. ? Do an activity or a hobby, like making bead jewelry, practicing needlepoint, or working with wood. ? Mix up your normal routine. ? Take a short exercise break. Go for a quick walk or run up and down stairs. ? Spend time in public places where smoking is not allowed.  Focus on doing something kind or helpful for someone else.  Call a friend or family member to talk during a craving.  Join a support group.  Call a quit line, such as 1-800-QUIT-NOW.  Talk with your health care provider about medicines that might help you cope with cravings and make quitting easier for you.  How can I deal with withdrawal symptoms? Your body may experience negative effects as it tries to get used to not having nicotine in the system. These effects are called withdrawal symptoms. They may include:  Feeling hungrier than normal.  Trouble concentrating.  Irritability.  Trouble sleeping.  Feeling depressed.  Restlessness and agitation.  Craving a cigarette.  To manage withdrawal symptoms:  Avoid places, people, and activities that trigger your cravings.  Remember why you want to quit.  Get plenty of sleep.  Avoid coffee and other caffeinated drinks. These may worsen some of your  symptoms.  How can I handle social situations? Social situations can be difficult when you are quitting smoking, especially in the first few weeks. To manage this, you can:  Avoid parties, bars, and other social situations where people might be smoking.  Avoid alcohol.  Leave right away if you have the urge to smoke.  Explain to your family and friends that you are quitting smoking. Ask for understanding and support.  Plan activities with friends or family where smoking is not an option.  What are some ways I can cope with stress? Wanting to smoke may cause stress, and stress can make you want to smoke. Find ways to manage your stress. Relaxation techniques can help. For example:  Breathe slowly and deeply, in through your nose and out through your mouth.  Listen to soothing, relaxing music.  Talk with a family member or friend about your stress.  Light a candle.  Soak in a bath or take a shower.  Think about a peaceful place.  What are some ways I can prevent weight gain? Be aware that many people gain weight after they quit smoking. However, not everyone does. To keep from gaining weight, have a plan in place before you quit and stick to the plan after you quit. Your plan should include:  Having healthy snacks. When you have a craving, it may help to: ? Eat plain popcorn, crunchy carrots, celery, or other cut vegetables. ? Chew sugar-free gum.  Changing how you eat: ? Eat small portion sizes at meals. ?   Eat 4-6 small meals throughout the day instead of 1-2 large meals a day. ? Be mindful when you eat. Do not watch television or do other things that might distract you as you eat.  Exercising regularly: ? Make time to exercise each day. If you do not have time for a long workout, do short bouts of exercise for 5-10 minutes several times a day. ? Do some form of strengthening exercise, like weight lifting, and some form of aerobic exercise, like running or  swimming.  Drinking plenty of water or other low-calorie or no-calorie drinks. Drink 6-8 glasses of water daily, or as much as instructed by your health care provider.  Summary  Quitting smoking is a physical and mental challenge. You will face cravings, withdrawal symptoms, and temptation to smoke again. Preparation can help you as you go through these challenges.  You can cope with cravings by keeping your mouth busy (such as by chewing gum), keeping your body and hands busy, and making calls to family, friends, or a helpline for people who want to quit smoking.  You can cope with withdrawal symptoms by avoiding places where people smoke, avoiding drinks with caffeine, and getting plenty of rest.  Ask your health care provider about the different ways to prevent weight gain, avoid stress, and handle social situations. This information is not intended to replace advice given to you by your health care provider. Make sure you discuss any questions you have with your health care provider. Document Released: 12/03/2016 Document Revised: 12/03/2016 Document Reviewed: 12/03/2016 Elsevier Interactive Patient Education  2018 Elsevier Inc.  

## 2018-02-22 NOTE — Progress Notes (Signed)
BP 106/70 (BP Location: Left Arm, Patient Position: Sitting, Cuff Size: Normal)   Pulse (!) 55   Temp (!) 97.2 F (36.2 C)   Ht 5\' 11"  (1.803 m)   Wt 185 lb 4 oz (84 kg)   SpO2 98%   BMI 25.84 kg/m    Subjective:    Patient ID: Kevin Davis, male    DOB: May 13, 1956, 62 y.o.   MRN: [id number]  HPI: Kevin Davis is a 62 y.o. male presenting on 02/22/2018 for Follow-up   HPI  Pt is still working out at the gym regularly  He is still smoking  He has had some phlegm.  It comes and goes.  occassional wheezing.   Relevant past medical, surgical, family and social history reviewed and updated as indicated. Interim medical history since our last visit reviewed. Allergies and medications reviewed and updated.   Current Outpatient Medications:  .  Ascorbic Acid (VITAMIN C PO), Take 0.5 tablets by mouth daily. , Disp: , Rfl:  .  B Complex Vitamins (VITAMIN B COMPLEX PO), Take 0.5 tablets by mouth daily., Disp: , Rfl:  .  Cholecalciferol (VITAMIN D PO), Take 0.5 tablets by mouth daily., Disp: , Rfl:  .  Green Tea 315 MG CAPS, Take 1 capsule by mouth daily., Disp: , Rfl:  .  Cod Liver Oil CAPS, Take 1 capsule by mouth daily., Disp: , Rfl:  .  Multiple Vitamins-Minerals (ZINC PO), Take 0.5 tablets by mouth daily., Disp: , Rfl:  .  SELENIUM PO, Take 0.5 tablets by mouth daily. Reported on 02/24/2016, Disp: , Rfl:    Review of Systems  Constitutional: Positive for fatigue. Negative for appetite change, chills, diaphoresis, fever and unexpected weight change.  HENT: Positive for dental problem. Negative for congestion, drooling, ear pain, facial swelling, hearing loss, mouth sores, sneezing, sore throat, trouble swallowing and voice change.   Eyes: Negative for pain, discharge, redness, itching and visual disturbance.  Respiratory: Positive for wheezing. Negative for cough, choking and shortness of breath.   Cardiovascular: Negative for chest pain, palpitations and leg swelling.   Gastrointestinal: Negative for abdominal pain, blood in stool, constipation, diarrhea and vomiting.  Endocrine: Negative for cold intolerance, heat intolerance and polydipsia.  Genitourinary: Negative for decreased urine volume, dysuria and hematuria.  Musculoskeletal: Positive for back pain. Negative for arthralgias and gait problem.  Skin: Negative for rash.  Allergic/Immunologic: Negative for environmental allergies.  Neurological: Negative for seizures, syncope, light-headedness and headaches.  Hematological: Negative for adenopathy.  Psychiatric/Behavioral: Negative for agitation, dysphoric mood and suicidal ideas. The patient is nervous/anxious.     Per HPI unless specifically indicated above     Objective:    BP 106/70 (BP Location: Left Arm, Patient Position: Sitting, Cuff Size: Normal)   Pulse (!) 55   Temp (!) 97.2 F (36.2 C)   Ht 5\' 11"  (1.803 m)   Wt 185 lb 4 oz (84 kg)   SpO2 98%   BMI 25.84 kg/m   Wt Readings from Last 3 Encounters:  02/22/18 185 lb 4 oz (84 kg)  02/23/17 198 lb (89.8 kg)  02/24/16 195 lb 6.4 oz (88.6 kg)    Physical Exam  Constitutional: He is oriented to person, place, and time. He appears well-developed and well-nourished.  HENT:  Head: Normocephalic and atraumatic.  Neck: Neck supple.  Cardiovascular: Normal rate and regular rhythm.  Pulmonary/Chest: Effort normal and breath sounds normal. He has no wheezes.  Abdominal: Soft. Bowel sounds are normal. There  is no hepatosplenomegaly. There is no tenderness.  Musculoskeletal: He exhibits no edema.  Lymphadenopathy:    He has no cervical adenopathy.  Neurological: He is alert and oriented to person, place, and time.  Skin: Skin is warm and dry.  Psychiatric: He has a normal mood and affect. His behavior is normal.  Vitals reviewed.       Assessment & Plan:    Encounter Diagnoses  Name Primary?  . Adult general medical examination Yes  . Cigarette nicotine dependence without  complication   . Screening for prostate cancer     -Check psa.  Will call with results -counseled on Smoking cessation -encouraged pt to continue regular exercise -pt to Follow up 1 year.  RTO sooner prn

## 2019-02-19 ENCOUNTER — Encounter: Payer: Self-pay | Admitting: Physician Assistant

## 2019-02-21 ENCOUNTER — Ambulatory Visit: Payer: Self-pay | Admitting: Physician Assistant

## 2019-03-12 ENCOUNTER — Telehealth: Payer: Self-pay | Admitting: Family

## 2019-03-12 DIAGNOSIS — W57XXXA Bitten or stung by nonvenomous insect and other nonvenomous arthropods, initial encounter: Secondary | ICD-10-CM

## 2019-03-12 NOTE — Progress Notes (Signed)
Based on what you shared with me, I feel your condition warrants further evaluation and I recommend that you be seen for a face to face office visit.     NOTE: If you entered your credit card information for this eVisit, you will not be charged. You may see a "hold" on your card for the $35 but that hold will drop off and you will not have a charge processed.  If you are having a true medical emergency please call 911.  If you need an urgent face to face visit, [place] has four urgent care centers for your convenience.    PLEASE NOTE: THE [facility] LOCATIONS AND URGENT CARE CLINICS DO NOT HAVE THE TESTING FOR CORONAVIRUS COVID19 AVAILABLE.  IF YOU FEEL YOU NEED THIS TEST YOU MUST HAVE AN ORDER TO GO TO A TESTING LOCATION FROM YOUR PROVIDER OR FROM A SCREENING E-VISIT     [email or web address] to reserve your spot online an avoid wait times  [facility] [address], [address] [place], [place] [address] 8 am to 8 pm Monday-Friday 10 am to 4 pm Saturday-Sunday *Across the street from [employer]  [facility]  [address] [place], [address] 8 am to 5 pm Monday-Friday * In the [facility]   The following sites will take your insurance:  . [facility]  [phone] Get Driving Directions Find a Provider at this Location  [address] [place], [place] [address] . 10 am to 8 pm Monday-Friday . 12 pm to 8 pm Saturday-Sunday   . [facility]  [phone] Get Driving Directions Find a Provider at this Location  [address], [address] [place], [place] [address] . 8 am to 8 pm Monday-Friday . 9 am to 6 pm Saturday . 11 am to 6 pm Sunday   . [facility] [place]  [phone] Get Driving Directions  [address] [place], [place] [address] . 8 am to 8 pm Monday-Friday . 8 am to 4 pm Saturday-Sunday   Your  e-visit answers were reviewed by a board certified advanced clinical practitioner to complete your personal care plan.  Thank you for using e-Visits. 

## 2019-09-17 ENCOUNTER — Telehealth: Payer: Self-pay

## 2019-09-17 NOTE — Telephone Encounter (Signed)
Faxed over pt. med[employer]ist application on [phone]. Pt. Eligibility is on 09/17/2019 till 09/16/2020.   Kevin Davis

## 2019-09-20 ENCOUNTER — Encounter: Payer: Self-pay | Admitting: Physician Assistant

## 2019-09-20 ENCOUNTER — Other Ambulatory Visit: Payer: Self-pay

## 2019-09-20 ENCOUNTER — Ambulatory Visit: Payer: Self-pay | Admitting: Physician Assistant

## 2019-09-20 VITALS — BP 130/80 | HR 85 | Temp 99.1°F | Wt 183.1 lb

## 2019-09-20 DIAGNOSIS — Z2821 Immunization not carried out because of patient refusal: Secondary | ICD-10-CM

## 2019-09-20 DIAGNOSIS — Z7689 Persons encountering health services in other specified circumstances: Secondary | ICD-10-CM

## 2019-09-20 DIAGNOSIS — Z125 Encounter for screening for malignant neoplasm of prostate: Secondary | ICD-10-CM

## 2019-09-20 DIAGNOSIS — R490 Dysphonia: Secondary | ICD-10-CM

## 2019-09-20 DIAGNOSIS — F17200 Nicotine dependence, unspecified, uncomplicated: Secondary | ICD-10-CM

## 2019-09-20 DIAGNOSIS — Z13220 Encounter for screening for lipoid disorders: Secondary | ICD-10-CM

## 2019-09-20 NOTE — Progress Notes (Signed)
BP 130/80   Pulse 85   Temp 99.1 F (37.3 C)   SpO2 99%    Subjective:    Patient ID: Kevin Davis, male    DOB: 1956-10-01, 63 y.o.   MRN: [id number]  HPI: Kevin Davis is a 63 y.o. male presenting on 09/20/2019 for New Patient (Initial Visit) (pt states he did not go anywhere else since coming to [facility])   HPI  Pt had a negative covid 19 screening questionnaire.   Pt is a returning pt.  His Last appointment here was 02/22/2018  pt is worried something is "in his neck".  He has started with this feeling 4 wk ago.  He feels this comes and goes.   He is having No trouble swallowing.  He is feeling hoarse at times but says this too comes and goes.  He denies fevers.     Pt is smoking 1 1/2 ppd.    He is not working  Pt has no other complaints   Relevant past medical, surgical, family and social history reviewed and updated as indicated. Interim medical history since our last visit reviewed. Allergies and medications reviewed and updated.    Current Outpatient Medications:  .  Ascorbic Acid (VITAMIN C PO), Take 0.5 tablets by mouth daily. , Disp: , Rfl:  .  B Complex Vitamins (VITAMIN B COMPLEX PO), Take 0.5 tablets by mouth daily., Disp: , Rfl:  .  Cholecalciferol (VITAMIN D PO), Take 0.5 tablets by mouth daily., Disp: , Rfl:  .  Multiple Vitamins-Minerals (ZINC PO), Take 0.5-1 tablets by mouth daily. , Disp: , Rfl:  .  SELENIUM PO, Take 0.5 tablets by mouth daily. Reported on 02/24/2016, Disp: , Rfl:  .  Turmeric (QC TUMERIC COMPLEX PO), Take by mouth daily., Disp: , Rfl:    Review of Systems  Per HPI unless specifically indicated above     Objective:    BP 130/80   Pulse 85   Temp 99.1 F (37.3 C)   SpO2 99%   Wt Readings from Last 3 Encounters:  02/22/18 185 lb 4 oz (84 kg)  02/23/17 198 lb (89.8 kg)  02/24/16 195 lb 6.4 oz (88.6 kg)    Physical Exam Vitals signs reviewed.  Constitutional:      General: He is not in acute distress.    Appearance:  Normal appearance. He is well-developed. He is not ill-appearing.  HENT:     Head: Normocephalic and atraumatic.     Right Ear: Tympanic membrane normal.     Left Ear: Tympanic membrane normal.     Mouth/Throat:     Comments: Voice is normal with no hoarseness heard Eyes:     Conjunctiva/sclera: Conjunctivae normal.     Pupils: Pupils are equal, round, and reactive to light.  Neck:     Musculoskeletal: Neck supple. Normal range of motion. No edema or erythema.     Thyroid: No thyroid mass, thyromegaly or thyroid tenderness.     Comments: Examination of the neck is normal.  No masses palpable.  No swelling, no erythema.  No adenopathy.   Cardiovascular:     Rate and Rhythm: Normal rate and regular rhythm.  Pulmonary:     Effort: Pulmonary effort is normal.     Breath sounds: Normal breath sounds. No wheezing or rales.  Abdominal:     General: Bowel sounds are normal.     Palpations: Abdomen is soft. There is no mass.     Tenderness: There  is no abdominal tenderness.  Musculoskeletal:     Right lower leg: No edema.     Left lower leg: No edema.  Lymphadenopathy:     Cervical: No cervical adenopathy.  Skin:    General: Skin is warm and dry.     Findings: No rash.  Neurological:     Mental Status: He is alert and oriented to person, place, and time.  Psychiatric:        Attention and Perception: Attention normal.        Speech: Speech normal.        Behavior: Behavior is cooperative.         Assessment & Plan:    Encounter Diagnoses  Name Primary?  . Encounter to establish care Yes  . Tobacco use disorder   . Screening for prostate cancer   . Screening for hyperlipidemia   . Hoarseness   . Influenza vaccination declined       -Discussed updating labs.  Pt has long conversation about he doesn't know if he wants to get labs or not.  Discussed with pt that they are recommended and they will be ordered and he will need to decide for himself if he wants to go and have  them done.  ordered psa, lipid, cmp  -Colonoscopy good until 2021  -offered referral to ENT for evaluation of hoarsenss, particularly in light of significant smoking history.  Pt declines Referral to ENT for hoarseness  -Counseled smoking cessation  -pt declined influenza vaccination  -pt to Follow up 1 year.   Pt to contact office sooner prn

## 2019-09-21 ENCOUNTER — Other Ambulatory Visit (HOSPITAL_COMMUNITY)
Admission: RE | Admit: 2019-09-21 | Discharge: 2019-09-21 | Disposition: A | Discharge status: Home / Self Care | Payer: Self-pay | Source: Ambulatory Visit | Attending: Physician Assistant | Admitting: Physician Assistant

## 2019-09-21 DIAGNOSIS — Z125 Encounter for screening for malignant neoplasm of prostate: Secondary | ICD-10-CM | POA: Insufficient documentation

## 2019-09-21 DIAGNOSIS — Z13220 Encounter for screening for lipoid disorders: Secondary | ICD-10-CM | POA: Insufficient documentation

## 2019-09-21 LAB — COMPREHENSIVE METABOLIC PANEL
ALT: 18 U/L (ref 0–44)
AST: 23 U/L (ref 15–41)
Albumin: 4.3 g/dL (ref 3.5–5.0)
Alkaline Phosphatase: 69 U/L (ref 38–126)
Anion gap: 10 (ref 5–15)
BUN: 12 mg/dL (ref 8–23)
CO2: 24 mmol/L (ref 22–32)
Calcium: 9.3 mg/dL (ref 8.9–10.3)
Chloride: 106 mmol/L (ref 98–111)
Creatinine, Ser: 1.17 mg/dL (ref 0.61–1.24)
GFR calc Af Amer: >60 mL/min (ref >60)
GFR calc non Af Amer: >60 mL/min (ref >60)
Glucose, Bld: 84 mg/dL (ref 70–99)
Potassium: 4.2 mmol/L (ref 3.5–5.1)
Sodium: 140 mmol/L (ref 135–145)
Total Bilirubin: 1.8 mg/dL — ABNORMAL HIGH (ref 0.3–1.2)
Total Protein: 7.3 g/dL (ref 6.5–8.1)

## 2019-09-21 LAB — LIPID PANEL
Cholesterol: 145 mg/dL (ref 0–200)
HDL: 43 mg/dL (ref >40)
LDL Cholesterol: 77 mg/dL (ref 0–99)
Total CHOL/HDL Ratio: 3.4 RATIO
Triglycerides: 123 mg/dL (ref <150)
VLDL: 25 mg/dL (ref 0–40)

## 2019-09-21 LAB — PSA: Prostatic Specific Antigen: 2.2 ng/mL (ref 0.00–4.00)

## 2020-05-12 ENCOUNTER — Encounter: Payer: Self-pay | Admitting: Internal Medicine

## 2020-09-22 ENCOUNTER — Ambulatory Visit: Payer: Self-pay | Admitting: Physician Assistant

## 2021-04-13 ENCOUNTER — Telehealth: Payer: Self-pay

## 2021-04-13 NOTE — Telephone Encounter (Addendum)
Pt contacted [facility] by phone to inquire about Re-Enrolling into the [facility] Un[facility] Program.  Pt states he last attended [employer] in 2020.   Pt has past medical hx of skin cancer.  2015 or 2016? A dark freckled growth was removed from shoulder and stomach area during this time  Chief Complaint(s) Pt states he is in need of a PCP due to his concern regarding a rash through out his body with a red appearance and on legs, feelings of pins and needles throughout body.   Pt also has pain/burning sensation within kidneys and issues with an urgent bladder.  He states he has been feeling like this for about 2 weeks since he last cut his grass.   States he does not remember seeing any ticks on his body but is not sure what he may have been biten by.  PLAN -Scheduled a [facility] appointment Beatris Ship, April 26 @ 2pm)   -Referred to [email or web address] website to apply for Food Stamps on-line and provided Social Services direct number per request of pt

## 2021-04-14 NOTE — Congregational Nurse Program (Signed)
Pt attended [facility] to get established with a primary care provider   States he last saw PCP at [facility] about 1 year ago   States her chief complaint(s)  Pt states he is in need of a PCP due to his concern regarding a rash through out his body with a red appearance and on legs, feelings of pins and needles throughout body.   Pt also has pain/burning sensation within kidneys and issues with an urgent bladder.  He states he has been feeling like this for about 2 weeks since he last cut his grass.   States he does not remember seeing any ticks on his body but is not sure what he may have been biten by.  Plan -  Enrollment and eligibility was completed on today's visit with [facility] Program D.Luciana Axe   -Referral given to RN Nurse Case Manager, Mayra Reel, for initial and continous medical case management upon completion of first medical appointment with the [place] Appointment was scheduled for Monday, 5.2.22 @ 10:30 am at the [employer] of [place].

## 2021-04-15 ENCOUNTER — Ambulatory Visit: Payer: Self-pay | Admitting: Physician Assistant

## 2021-04-20 ENCOUNTER — Encounter: Payer: Self-pay | Admitting: Internal Medicine

## 2021-04-20 ENCOUNTER — Other Ambulatory Visit: Payer: Self-pay

## 2021-04-20 ENCOUNTER — Telehealth: Payer: Self-pay

## 2021-04-20 ENCOUNTER — Ambulatory Visit: Payer: Self-pay | Admitting: Physician Assistant

## 2021-04-20 ENCOUNTER — Other Ambulatory Visit (HOSPITAL_COMMUNITY)
Admission: RE | Admit: 2021-04-20 | Discharge: 2021-04-20 | Disposition: A | Discharge status: Home / Self Care | Payer: Self-pay | Source: Ambulatory Visit | Attending: Physician Assistant | Admitting: Physician Assistant

## 2021-04-20 ENCOUNTER — Encounter: Payer: Self-pay | Admitting: Physician Assistant

## 2021-04-20 VITALS — BP 113/74 | HR 61 | Temp 96.9°F

## 2021-04-20 DIAGNOSIS — Z125 Encounter for screening for malignant neoplasm of prostate: Secondary | ICD-10-CM

## 2021-04-20 DIAGNOSIS — F17200 Nicotine dependence, unspecified, uncomplicated: Secondary | ICD-10-CM

## 2021-04-20 DIAGNOSIS — Z8719 Personal history of other diseases of the digestive system: Secondary | ICD-10-CM

## 2021-04-20 DIAGNOSIS — Z7689 Persons encountering health services in other specified circumstances: Secondary | ICD-10-CM

## 2021-04-20 DIAGNOSIS — Z860100 Personal history of colon polyps, unspecified: Secondary | ICD-10-CM

## 2021-04-20 DIAGNOSIS — L299 Pruritus, unspecified: Secondary | ICD-10-CM

## 2021-04-20 LAB — COMPREHENSIVE METABOLIC PANEL
ALT: 15 U/L (ref 0–44)
AST: 17 U/L (ref 15–41)
Albumin: 3.9 g/dL (ref 3.5–5.0)
Alkaline Phosphatase: 72 U/L (ref 38–126)
Anion gap: 6 (ref 5–15)
BUN: 18 mg/dL (ref 8–23)
CO2: 23 mmol/L (ref 22–32)
Calcium: 9.5 mg/dL (ref 8.9–10.3)
Chloride: 108 mmol/L (ref 98–111)
Creatinine, Ser: 1.16 mg/dL (ref 0.61–1.24)
GFR, Estimated: >60 mL/min (ref >60)
Glucose, Bld: 94 mg/dL (ref 70–99)
Potassium: 3.9 mmol/L (ref 3.5–5.1)
Sodium: 137 mmol/L (ref 135–145)
Total Bilirubin: 0.8 mg/dL (ref 0.3–1.2)
Total Protein: 6.6 g/dL (ref 6.5–8.1)

## 2021-04-20 LAB — PSA: Prostatic Specific Antigen: 1.65 ng/mL (ref 0.00–4.00)

## 2021-04-20 NOTE — Telephone Encounter (Signed)
Called client to follow up after his visit to [facility] today. Client reports all went well. He has no questions today and he is planning on getting his labs drawn.  Will follow as needed.  Debria Garret RN [employer]

## 2021-04-20 NOTE — Patient Instructions (Addendum)
  Sarna Original Steroid-Free Anti-Itch Lotion, 7.5 oz  beneryl (diphenhdramine) as needed for itching

## 2021-04-20 NOTE — Progress Notes (Signed)
BP 113/74   Pulse 61   Temp (!) 96.9 F (36.1 C)   SpO2 94%    Subjective:    Patient ID: Kevin Davis, male    DOB: 1956/10/07, 65 y.o.   MRN: [id number]  HPI: Kevin Davis is a 65 y.o. male presenting on 04/20/2021 for No chief complaint on file.   HPI   Pt had a negative covid 19 screening questionaire.     Pt is a 23yoM who is a Returning pt with appointment to re-establish care.  He was last seen here on 09/20/2019.  He has not gotten the covid vaccination  He says he has Cut back smoking to 1 ppd  He has not been to see the gastroenterologist since 2016.  He is Past due for colonoscopy (polyps) and Liver US (hepatitis)  Pt c/o itching x 3 week.  It Started on legs but now feels all over.  He denies changes in soaps, detergents.  He got dentures in December.    Relevant past medical, surgical, family and social history reviewed and updated as indicated. Interim medical history since our last visit reviewed. Allergies and medications reviewed and updated.  No current outpatient medications on file.    Review of Systems  Per HPI unless specifically indicated above     Objective:    BP 113/74   Pulse 61   Temp (!) 96.9 F (36.1 C)   SpO2 94%   Wt Readings from Last 3 Encounters:  09/20/19 183 lb 1.6 oz (83.1 kg)  02/22/18 185 lb 4 oz (84 kg)  02/23/17 198 lb (89.8 kg)    Physical Exam Vitals reviewed.  Constitutional:      General: He is not in acute distress.    Appearance: He is well-developed. He is not toxic-appearing.     Comments: Pt smells extremely strongly of cigarette smoke.  HENT:     Head: Normocephalic and atraumatic.     Right Ear: Tympanic membrane and ear canal normal.     Left Ear: Tympanic membrane and ear canal normal.  Eyes:     Extraocular Movements: Extraocular movements intact.     Conjunctiva/sclera: Conjunctivae normal.     Pupils: Pupils are equal, round, and reactive to light.  Cardiovascular:     Rate and  Rhythm: Normal rate and regular rhythm.  Pulmonary:     Effort: Pulmonary effort is normal. No respiratory distress.     Breath sounds: No stridor. Wheezing present. No rhonchi.     Comments: Occasional scattered expiratory wheeze Abdominal:     General: Bowel sounds are normal.     Palpations: Abdomen is soft.     Tenderness: There is no abdominal tenderness.  Musculoskeletal:     Cervical back: Neck supple.     Right lower leg: No edema.     Left lower leg: No edema.  Lymphadenopathy:     Cervical: No cervical adenopathy.  Skin:    General: Skin is warm and dry.     Comments: Skin appears irritated in places, perhaps dermatitis, but no discrete rash seen  Neurological:     Mental Status: He is alert and oriented to person, place, and time.     Motor: No weakness or tremor.     Gait: Gait is intact.  Psychiatric:        Attention and Perception: Attention normal.        Speech: Speech normal.        Behavior: Behavior  normal. Behavior is cooperative.                 Assessment & Plan:    Encounter Diagnoses  Name Primary?  . Encounter to establish care Yes  . History of chronic hepatitis   . Tobacco use disorder   . History of colonic polyps   . Screening for prostate cancer   . Pruritus      -will check LFTs and PSA.  He will be called with results -will Re-refer to GI -counseled smoking cessation -encouraged pt to get covid vaccination -he can use OTC benedryl prn and sarna lotion prn to help with itching -pt is given cafa / [employer] financial assistanance application -pt to follow up 1 year.  He is to contact office sooner prn

## 2021-07-20 ENCOUNTER — Other Ambulatory Visit: Payer: Self-pay

## 2021-07-20 ENCOUNTER — Encounter: Payer: Self-pay | Admitting: Gastroenterology

## 2021-07-20 ENCOUNTER — Ambulatory Visit (INDEPENDENT_AMBULATORY_CARE_PROVIDER_SITE_OTHER): Payer: Self-pay | Admitting: Gastroenterology

## 2021-07-20 ENCOUNTER — Encounter: Payer: Self-pay | Admitting: *Deleted

## 2021-07-20 VITALS — BP 130/77 | HR 67 | Temp 97.7°F | Ht 71.0 in | Wt 186.0 lb

## 2021-07-20 DIAGNOSIS — R399 Unspecified symptoms and signs involving the genitourinary system: Secondary | ICD-10-CM

## 2021-07-20 DIAGNOSIS — B181 Chronic viral hepatitis B without delta-agent: Secondary | ICD-10-CM

## 2021-07-20 DIAGNOSIS — Z86010 Personal history of colonic polyps: Secondary | ICD-10-CM

## 2021-07-20 NOTE — Patient Instructions (Signed)
Please have your urine and lab tests done at [facility] lab at your convenience.  Please complete ultrasound as scheduled.  Colonoscopy is due at anytime but per your request we will hold off until ultrasound and labs are back.

## 2021-07-20 NOTE — Progress Notes (Signed)
Primary Care Physician: Soyla Dryer, PA-C  Primary Gastroenterologist:  Garfield Cornea, MD   Chief Complaint  Patient presents with   Consult    Due for 5 year repeat. Occas diarrhea, no blood in stool    HPI: Kevin Davis is a 65 y.o. male here for follow up. Last seen in 2016. H/O chronic inactive Hepatitis B, colon polyps.    Patient briefly seen at hepatitis clinic by Dr. Adria Dill and Dr. Graylon Good with infectious diseases in 2014. Diagnosed with Hep B in 2010. Labs in 2014, he had a positive hepatitis B surface antigen, positive hepatitis Be antibody, undetectable hepatitis B DNA level, hepatitis B core antibody positive, hepatitis B surface antibody nonreactive, HIV NR.  After he was seen by our office in 2016 he had a repeat hepatitis B DNA level which was negative (May 2016).  Ultrasound with elastography June 2016.  Noted to have a stable left hepatic lobe hemangioma. Metavir fibrosis score of F2/F3.  We had plans for repeat LFTs, hepatitis Be antigen, hepatitis Be antibody, HBV DNA, ultrasound in 1 year but patient canceled due to lack of insurance.  Colonoscopy June 2016, 2 tubular adenomas removed.  Advised 5-year surveillance colonoscopy.  Patient presents back today to reestablish care.  States back in April he mowed his lawn for 90 minutes.  Shortly after that he started having itching in his lower legs.  By the end of the day he had itching all over his body.  No visible rash.  Felt like pinpricks/itching.  He became alarmed that it might be his liver or kidneys acting up.  He is also noted pain in the "kidney area".  "Kidneys feel inflamed or swollen".  Has noted diminished urinary output.  Has urinary urgency and frequency.  He first noted that after he had COVID in March 2020.  States it was very bad and has improved somewhat.  Still has symptoms at least twice per day.  Feels like his urinary stream is not as strong as it used to be.  Denies any dysuria.  Had  similar symptoms several years ago, treated with antibiotic with modest improvement but symptoms have persisted.  He states he drinks adequate fluids.  Typically water, juice.  No soft drinks.  Over a year ago he made the decision to cut way back on alcohol use.  May go months at a time with no alcohol but never more than once per month.   Patient wonders if denture adhesive that he can use daily for 4 months did any damage to his organs or causing the itching.  He states he read the package insert which said not to use daily could cause nerve damage or neuropathy.  He has been off for 4 months but noted no improvement.  He continues to have metallic taste in his mouth even after stopping the adhesive.   Movements are regular. No melena, brbpr. Feels like stools incomplete over last two years. No reflux, vomiting, dysphagia. Denies need to screen for HIV or Hep C since last screen      No current outpatient medications on file.   No current facility-administered medications for this visit.    Allergies as of 07/20/2021   (No Known Allergies)   Past Medical History:  Diagnosis Date   Basal cell carcinoma 2016   1- lower abd , 1- upper L arm   Hepatitis B    Past Surgical History:  Procedure Laterality Date  COLONOSCOPY N/A 05/26/2015   surgeon:  Daneil Dolin, MD;  Location: [facility];  Service: Endoscopy;  Laterality: N/A;  945   SKIN BIOPSY     Family History  Problem Relation Age of Onset   Cancer Mother        breast cancer   Alcohol abuse Father    Acute myelogenous leukemia Sister    Hepatitis B Sister    Pancreatic cancer Paternal Grandfather        died age 56   Stroke Paternal Grandfather    Lymphoma Cousin    Colon cancer Neg Hx    Social History   Tobacco Use   Smoking status: Every Day    Packs/day: 1.50    Years: 31.00    Pack years: 46.50    Types: Cigarettes   Smokeless tobacco: Never  Vaping Use   Vaping Use: Never used  Substance Use Topics    Alcohol use: Yes    Alcohol/week: 4.0 standard drinks    Types: 4 Shots of liquor per week    Comment: 2-3 times per week, several drinks/liquour, rar per pt 07/20/21   Drug use: No    ROS:  General: Negative for anorexia, weight loss, fever, chills, fatigue, weakness. ENT: Negative for hoarseness, difficulty swallowing , nasal congestion. CV: Negative for chest pain, angina, palpitations, dyspnea on exertion, peripheral edema.  Respiratory: Negative for dyspnea at rest, dyspnea on exertion, cough, sputum, wheezing.  GI: See history of present illness. GU: See HPI  Endo: Negative for unusual weight change.    Physical Examination:   BP 130/77   Pulse 67   Temp 97.7 F (36.5 C)   Ht '5\' 11"'$  (1.803 m)   Wt 186 lb (84.4 kg)   BMI 25.94 kg/m   General: Well-nourished, well-developed in no acute distress.  Eyes: No icterus. Mouth: masked. Lungs: Clear to auscultation bilaterally.  Heart: Regular rate and rhythm, no murmurs rubs or gallops.  Abdomen: Bowel sounds are normal, nontender, nondistended, no hepatosplenomegaly or masses, no abdominal bruits or hernia , no rebound or guarding.   Extremities: No lower extremity edema. No clubbing or deformities. Neuro: Alert and oriented x 4   Skin: Warm and dry, no jaundice.   Psych: Alert and cooperative, normal mood and affect.  Labs:  Lab Results  Component Value Date   CREATININE 1.16 04/20/2021   BUN 18 04/20/2021   NA 137 04/20/2021   K 3.9 04/20/2021   CL 108 04/20/2021   CO2 23 04/20/2021   Lab Results  Component Value Date   ALT 15 04/20/2021   AST 17 04/20/2021   ALKPHOS 72 04/20/2021   BILITOT 0.8 04/20/2021   Lab Results  Component Value Date   PSA 1.8 02/23/2017      Imaging Studies: No results found.   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   Patient has complaints of diffuse itching, metallic  taste in his mouth for several months. Etiology unclear. He reports metallic taste occurred after getting dentures and using adhesive daily, reporting that later he read he should not use the adhesive on a daily basis due to concerns for causing neuropathy. He quit using adhesive several months back but symptoms persist. He was concerned itching could be sign of progressive liver or kidney disease or even sign of blood cancer (given his FH). His recent labs regarding LFTs, creatinine in 04/2021  were unremarkable. In addition, he feels his kidneys are swollen or irritation and has noted urinary symptoms with decreased stream, frequent urination and urgency for over a year.   From GI standpoint, we need to reassess his hepatic fibrosis to see if any progression. I'm glad to hear that he has essential cut out etoh with only rare use. Historically his Hep B has been inactive, will need to update labs at this time. Will also check CBC to screen for blood dyscrasia or anemia. Check u/a given urinary symptoms.   H/O colon polyps (adenomas): Due for surveillance colonoscopy. Patient would like to postpone until after pending labs and U/S.    Plan: Labs including Hep B labs, CMET, CBC. U/A with microscopic reflex. Abdominal u/s with elastography.  Colonoscopy on hold until after labs and u/s per patient request.

## 2021-07-21 ENCOUNTER — Other Ambulatory Visit (HOSPITAL_COMMUNITY)
Admission: RE | Admit: 2021-07-21 | Discharge: 2021-07-21 | Disposition: A | Discharge status: Home / Self Care | Payer: Self-pay | Source: Ambulatory Visit | Attending: Gastroenterology | Admitting: Gastroenterology

## 2021-07-21 DIAGNOSIS — B181 Chronic viral hepatitis B without delta-agent: Secondary | ICD-10-CM | POA: Insufficient documentation

## 2021-07-21 DIAGNOSIS — R399 Unspecified symptoms and signs involving the genitourinary system: Secondary | ICD-10-CM | POA: Insufficient documentation

## 2021-07-21 DIAGNOSIS — Z86010 Personal history of colonic polyps: Secondary | ICD-10-CM | POA: Insufficient documentation

## 2021-07-21 LAB — COMPREHENSIVE METABOLIC PANEL
ALT: 13 U/L (ref 0–44)
AST: 18 U/L (ref 15–41)
Albumin: 4.1 g/dL (ref 3.5–5.0)
Alkaline Phosphatase: 71 U/L (ref 38–126)
Anion gap: 6 (ref 5–15)
BUN: 16 mg/dL (ref 8–23)
CO2: 25 mmol/L (ref 22–32)
Calcium: 9.5 mg/dL (ref 8.9–10.3)
Chloride: 105 mmol/L (ref 98–111)
Creatinine, Ser: 1.13 mg/dL (ref 0.61–1.24)
GFR, Estimated: >60 mL/min (ref >60)
Glucose, Bld: 94 mg/dL (ref 70–99)
Potassium: 4.2 mmol/L (ref 3.5–5.1)
Sodium: 136 mmol/L (ref 135–145)
Total Bilirubin: 0.9 mg/dL (ref 0.3–1.2)
Total Protein: 6.9 g/dL (ref 6.5–8.1)

## 2021-07-21 LAB — CBC WITH DIFFERENTIAL/PLATELET
Abs Immature Granulocytes: 0.02 10*3/uL (ref 0.00–0.07)
Basophils Absolute: 0.1 10*3/uL (ref 0.0–0.1)
Basophils Relative: 1 %
Eosinophils Absolute: 0.2 10*3/uL (ref 0.0–0.5)
Eosinophils Relative: 3 %
HCT: 46.6 % (ref 39.0–52.0)
Hemoglobin: 15.7 g/dL (ref 13.0–17.0)
Immature Granulocytes: 0 %
Lymphocytes Relative: 48 %
Lymphs Abs: 3.5 10*3/uL (ref 0.7–4.0)
MCH: 34.4 pg — ABNORMAL HIGH (ref 26.0–34.0)
MCHC: 33.7 g/dL (ref 30.0–36.0)
MCV: 102.2 fL — ABNORMAL HIGH (ref 80.0–100.0)
Monocytes Absolute: 0.8 10*3/uL (ref 0.1–1.0)
Monocytes Relative: 11 %
Neutro Abs: 2.7 10*3/uL (ref 1.7–7.7)
Neutrophils Relative %: 37 %
Platelets: 191 10*3/uL (ref 150–400)
RBC: 4.56 MIL/uL (ref 4.22–5.81)
RDW: 13.9 % (ref 11.5–15.5)
WBC: 7.4 10*3/uL (ref 4.0–10.5)
nRBC: 0 % (ref 0.0–0.2)

## 2021-07-21 LAB — URINALYSIS, ROUTINE W REFLEX MICROSCOPIC
Bilirubin Urine: NEGATIVE
Glucose, UA: NEGATIVE mg/dL
Hgb urine dipstick: NEGATIVE
Ketones, ur: NEGATIVE mg/dL
Leukocytes,Ua: NEGATIVE
Nitrite: NEGATIVE
Protein, ur: NEGATIVE mg/dL
Specific Gravity, Urine: 1.02 (ref 1.005–1.030)
pH: 5 (ref 5.0–8.0)

## 2021-07-22 LAB — HEPATITIS B SURFACE ANTIBODY, QUANTITATIVE: Hep B S AB Quant (Post): <3.1 m[IU]/mL — ABNORMAL LOW (ref >9.9)

## 2021-07-22 LAB — HEPATITIS B E ANTIBODY: Hep B E Ab: POSITIVE — AB

## 2021-07-22 LAB — HEPATITIS B DNA, ULTRAQUANTITATIVE, PCR
HBV DNA SERPL PCR-ACNC: NOT DETECTED IU/mL
HBV DNA SERPL PCR-LOG IU: UNDETERMINED log10 IU/mL

## 2021-07-22 LAB — HEPATITIS B E ANTIGEN: Hep B E Ag: NEGATIVE

## 2021-07-23 LAB — HEPATITIS B SURFACE ANTIGEN: Hepatitis B Surface Ag: REACTIVE — AB

## 2021-07-24 ENCOUNTER — Other Ambulatory Visit: Payer: Self-pay | Admitting: *Deleted

## 2021-07-24 DIAGNOSIS — D7589 Other specified diseases of blood and blood-forming organs: Secondary | ICD-10-CM

## 2021-07-27 ENCOUNTER — Ambulatory Visit (HOSPITAL_COMMUNITY)
Admission: RE | Admit: 2021-07-27 | Discharge: 2021-07-27 | Disposition: A | Discharge status: Home / Self Care | Payer: Self-pay | Source: Ambulatory Visit | Attending: Gastroenterology | Admitting: Gastroenterology

## 2021-07-27 ENCOUNTER — Other Ambulatory Visit (HOSPITAL_COMMUNITY)
Admission: RE | Admit: 2021-07-27 | Discharge: 2021-07-27 | Disposition: A | Discharge status: Home / Self Care | Payer: Self-pay | Source: Ambulatory Visit | Attending: Gastroenterology | Admitting: Gastroenterology

## 2021-07-27 ENCOUNTER — Other Ambulatory Visit: Payer: Self-pay

## 2021-07-27 DIAGNOSIS — B181 Chronic viral hepatitis B without delta-agent: Secondary | ICD-10-CM | POA: Insufficient documentation

## 2021-07-27 DIAGNOSIS — Z86010 Personal history of colonic polyps: Secondary | ICD-10-CM | POA: Insufficient documentation

## 2021-07-27 DIAGNOSIS — D7589 Other specified diseases of blood and blood-forming organs: Secondary | ICD-10-CM | POA: Insufficient documentation

## 2021-07-27 DIAGNOSIS — R399 Unspecified symptoms and signs involving the genitourinary system: Secondary | ICD-10-CM | POA: Insufficient documentation

## 2021-07-27 LAB — VITAMIN B12: Vitamin B-12: 131 pg/mL — ABNORMAL LOW (ref 180–914)

## 2021-07-27 LAB — FOLATE: Folate: 5 ng/mL — ABNORMAL LOW (ref >5.9)

## 2021-07-28 ENCOUNTER — Other Ambulatory Visit: Payer: Self-pay | Admitting: Gastroenterology

## 2021-07-28 DIAGNOSIS — E538 Deficiency of other specified B group vitamins: Secondary | ICD-10-CM

## 2021-07-28 MED ORDER — CYANOCOBALAMIN 1000 MCG/ML IJ SOLN: INTRAMUSCULAR | 1 refills | Status: DC

## 2021-07-30 ENCOUNTER — Other Ambulatory Visit (HOSPITAL_COMMUNITY)
Admission: RE | Admit: 2021-07-30 | Discharge: 2021-07-30 | Disposition: A | Discharge status: Home / Self Care | Payer: Self-pay | Source: Ambulatory Visit | Attending: Gastroenterology | Admitting: Gastroenterology

## 2021-07-30 DIAGNOSIS — E538 Deficiency of other specified B group vitamins: Secondary | ICD-10-CM | POA: Insufficient documentation

## 2021-07-30 DIAGNOSIS — D7589 Other specified diseases of blood and blood-forming organs: Secondary | ICD-10-CM | POA: Insufficient documentation

## 2021-07-30 LAB — FOLATE: Folate: 5.3 ng/mL — ABNORMAL LOW (ref >5.9)

## 2021-07-30 LAB — VITAMIN B12: Vitamin B-12: 140 pg/mL — ABNORMAL LOW (ref 180–914)

## 2021-07-31 ENCOUNTER — Other Ambulatory Visit: Payer: Self-pay | Admitting: Gastroenterology

## 2021-07-31 DIAGNOSIS — E538 Deficiency of other specified B group vitamins: Secondary | ICD-10-CM

## 2021-07-31 LAB — INTRINSIC FACTOR ANTIBODIES: Intrinsic Factor: 1 AU/mL (ref 0.0–1.1)

## 2021-07-31 NOTE — Progress Notes (Signed)
Mailed lab requisition to pt. To have labs drawn in 4 weeks. Included sticky note on labs reminding pt not to have done for 4 weeks.

## 2021-08-03 ENCOUNTER — Telehealth: Payer: Self-pay | Admitting: Internal Medicine

## 2021-08-03 NOTE — Telephone Encounter (Signed)
Pt returning call. [phone]

## 2021-08-03 NOTE — Telephone Encounter (Signed)
Returned pt's call. See result note.

## 2021-08-21 ENCOUNTER — Telehealth: Payer: Self-pay | Admitting: Gastroenterology

## 2021-08-21 NOTE — Telephone Encounter (Signed)
Noted regarding colonoscopy. He did receive injection training with Tretha Sciara 08/05/21. If he has any further questions he should reach out.

## 2021-08-21 NOTE — Telephone Encounter (Signed)
Pt called stating that he had a little bit of trouble injecting the b12 last week, but that he watched some you tube videos and done better this week. Pt has decided to hold off on having a colonoscopy at this time. Just and FYI to [place].

## 2021-08-21 NOTE — Telephone Encounter (Signed)
Pt stated that he did better this week with the injection and would call if he had anymore issues.

## 2021-08-25 NOTE — Telephone Encounter (Signed)
Please NIC for one year follow up ov.

## 2021-09-03 ENCOUNTER — Other Ambulatory Visit: Payer: Self-pay

## 2021-09-03 ENCOUNTER — Other Ambulatory Visit (HOSPITAL_COMMUNITY)
Admission: RE | Admit: 2021-09-03 | Discharge: 2021-09-03 | Disposition: A | Discharge status: Home / Self Care | Payer: Self-pay | Source: Ambulatory Visit | Attending: Gastroenterology | Admitting: Gastroenterology

## 2021-09-03 DIAGNOSIS — E538 Deficiency of other specified B group vitamins: Secondary | ICD-10-CM | POA: Insufficient documentation

## 2021-09-03 LAB — VITAMIN B12: Vitamin B-12: 732 pg/mL (ref 180–914)

## 2021-09-03 LAB — FOLATE: Folate: 37.4 ng/mL (ref >5.9)

## 2021-09-07 ENCOUNTER — Other Ambulatory Visit: Payer: Self-pay | Admitting: Gastroenterology

## 2021-09-07 DIAGNOSIS — E538 Deficiency of other specified B group vitamins: Secondary | ICD-10-CM

## 2022-01-26 ENCOUNTER — Other Ambulatory Visit: Payer: Self-pay | Admitting: *Deleted

## 2022-01-26 DIAGNOSIS — E538 Deficiency of other specified B group vitamins: Secondary | ICD-10-CM

## 2022-03-02 ENCOUNTER — Other Ambulatory Visit (HOSPITAL_COMMUNITY)
Admission: RE | Admit: 2022-03-02 | Discharge: 2022-03-02 | Disposition: A | Discharge status: Home / Self Care | Payer: Self-pay | Source: Ambulatory Visit | Attending: Gastroenterology | Admitting: Gastroenterology

## 2022-03-02 DIAGNOSIS — E538 Deficiency of other specified B group vitamins: Secondary | ICD-10-CM | POA: Insufficient documentation

## 2022-03-02 LAB — FOLATE: Folate: 8.5 ng/mL (ref >5.9)

## 2022-03-02 LAB — VITAMIN B12: Vitamin B-12: 794 pg/mL (ref 180–914)

## 2022-04-20 ENCOUNTER — Encounter: Payer: Self-pay | Admitting: Physician Assistant

## 2022-04-20 ENCOUNTER — Other Ambulatory Visit (HOSPITAL_COMMUNITY)
Admission: RE | Admit: 2022-04-20 | Discharge: 2022-04-20 | Disposition: A | Discharge status: Home / Self Care | Payer: Self-pay | Source: Ambulatory Visit | Attending: Physician Assistant | Admitting: Physician Assistant

## 2022-04-20 ENCOUNTER — Ambulatory Visit: Payer: Self-pay | Admitting: Physician Assistant

## 2022-04-20 VITALS — BP 130/74 | HR 70 | Temp 97.7°F | Wt 198.0 lb

## 2022-04-20 DIAGNOSIS — Z0000 Encounter for general adult medical examination without abnormal findings: Secondary | ICD-10-CM

## 2022-04-20 DIAGNOSIS — Z125 Encounter for screening for malignant neoplasm of prostate: Secondary | ICD-10-CM | POA: Insufficient documentation

## 2022-04-20 DIAGNOSIS — F17200 Nicotine dependence, unspecified, uncomplicated: Secondary | ICD-10-CM

## 2022-04-20 LAB — PSA: Prostatic Specific Antigen: 2.01 ng/mL (ref 0.00–4.00)

## 2022-04-20 NOTE — Patient Instructions (Signed)
COVID-19 vaccination significantly lowers your risk of severe illness, hospitalization, and death if you get infected. Compared to people who are up to date with their COVID-19 vaccinations, unvaccinated people aremore likely to get COVID-19, much more likely to be hospitalized with COVID-19, and much more likely to die from COVID-19. ?Like all vaccines, COVID-19 vaccines are not 100% effective at preventing infection. Some people who are up to date with their COVID-19 vaccinations will get COVID-19 breakthrough infection. However, staying up to date with your COVID-19 vaccinations means that you are less likely to have a breakthrough infection and, if you do get sick, you are less likely to get severely ill or die. Staying up to date with COVID-19 vaccination also means you are less likely to spread the disease to others and increases your protection against new variants of SARS-CoV-2, the virus that causes COVID-19. ? ?

## 2022-04-20 NOTE — Progress Notes (Signed)
? ?BP 130/74   Pulse 70   Temp 97.7 ?F (36.5 ?C)   Wt 198 lb (89.8 kg)   SpO2 99%   BMI 27.62 kg/m?   ? ?Subjective:  ? ? Patient ID: Kevin Davis, male    DOB: 09/18/1956, 66 y.o.   MRN: [id number] ? ?HPI: ?Kevin Davis is a 66 y.o. male presenting on 04/20/2022 for Annual Exam ? ? ?HPI ? ?Chief Complaint  ?Patient presents with  ? Annual Exam  ? ? ?Pt says he Still smokes > 1ppd ?He is not working ?He did not get the covid vaccination ?He exercises daily ?He is doing well and has no complaints ? ? ? ? ?Relevant past medical, surgical, family and social history reviewed and updated as indicated. Interim medical history since our last visit reviewed. ?Allergies and medications reviewed and updated. ? ? ?Current Outpatient Medications:  ?  cyanocobalamin (,VITAMIN B-12,) 1000 MCG/ML injection, 109mg subcutaneous weekly for four weeks, then once per month., Disp: 4 mL, Rfl: 1 ?  folic acid (FOLVITE) 1 MG tablet, Take 1 mg by mouth daily., Disp: , Rfl:  ? ? ? ?Review of Systems ? ?Per HPI unless specifically indicated above ? ?   ?Objective:  ?  ?BP 130/74   Pulse 70   Temp 97.7 ?F (36.5 ?C)   Wt 198 lb (89.8 kg)   SpO2 99%   BMI 27.62 kg/m?   ?Wt Readings from Last 3 Encounters:  ?04/20/22 198 lb (89.8 kg)  ?07/20/21 186 lb (84.4 kg)  ?09/20/19 183 lb 1.6 oz (83.1 kg)  ?  ?Physical Exam ?Vitals reviewed.  ?Constitutional:   ?   General: He is not in acute distress. ?   Appearance: He is well-developed. He is not ill-appearing.  ?HENT:  ?   Head: Normocephalic and atraumatic.  ?   Right Ear: Tympanic membrane, ear canal and external ear normal.  ?   Left Ear: Tympanic membrane, ear canal and external ear normal.  ?Eyes:  ?   Extraocular Movements: Extraocular movements intact.  ?   Conjunctiva/sclera: Conjunctivae normal.  ?   Pupils: Pupils are equal, round, and reactive to light.  ?Neck:  ?   Thyroid: No thyromegaly.  ?Cardiovascular:  ?   Rate and Rhythm: Normal rate and regular rhythm.  ?Pulmonary:  ?    Effort: Pulmonary effort is normal.  ?   Breath sounds: Normal breath sounds. No wheezing or rales.  ?Abdominal:  ?   General: Bowel sounds are normal.  ?   Palpations: Abdomen is soft. There is no mass.  ?   Tenderness: There is no abdominal tenderness.  ?Musculoskeletal:  ?   Cervical back: Neck supple.  ?   Right lower leg: No edema.  ?   Left lower leg: No edema.  ?Lymphadenopathy:  ?   Cervical: No cervical adenopathy.  ?Skin: ?   General: Skin is warm and dry.  ?   Findings: No rash.  ?Neurological:  ?   Mental Status: He is alert and oriented to person, place, and time.  ?   Motor: No weakness or tremor.  ?   Gait: Gait is intact.  ?   Deep Tendon Reflexes:  ?   Reflex Scores: ?     Patellar reflexes are 2+ on the right side and 2+ on the left side. ?Psychiatric:     ?   Attention and Perception: Attention normal.     ?   Speech: Speech normal.     ?  Behavior: Behavior normal. Behavior is cooperative.  ? ? ? ? ? ?   ?Assessment & Plan:  ? ?Encounter Diagnoses  ?Name Primary?  ? Visit for annual health examination Yes  ? Tobacco use disorder   ? Screening for prostate cancer   ? ? ?-pt was educated and encouraged to get Covid vacciantion ?-will check Psa.  He will be called with results ?-encouraged smoking cessation ?-Will not schedule follow up- he will be 67yo in February so his medicare will start February 1.  He is encouraged to contact office before that time if needed.  Discussed that he will need to establish care with new pcp when his medicare starts as this office does not accept insurance.  He states understanding ? ?

## 2022-07-02 ENCOUNTER — Telehealth: Payer: Self-pay | Admitting: Internal Medicine

## 2022-07-02 NOTE — Telephone Encounter (Signed)
Pt LMOM after hours that he was working on getting all the info needed for [employer] assistance and will also have Medicare in Feb. 2024. He wanted [place] to know his part in the delay for scheduling.

## 2022-07-06 NOTE — Telephone Encounter (Signed)
Noted  

## 2022-07-06 NOTE — Telephone Encounter (Signed)
It has been nearly a year since he was seen in the office. He had reported wanting to hold off on colonoscopy last year, seen 08/21/21 telephone note.  He should have NIC for yearly follow up ov in 08/2021.

## 2022-07-06 NOTE — Telephone Encounter (Signed)
FYI: Leslie  °

## 2022-08-04 ENCOUNTER — Encounter: Payer: Self-pay | Admitting: *Deleted

## 2022-09-20 NOTE — Progress Notes (Addendum)
GI Office Note    Referring Provider: Soyla Dryer, PA-C Primary Care Physician:  Soyla Dryer, PA-C  Primary Gastroenterologist: Garfield Cornea, MD   Chief Complaint   Chief Complaint  Patient presents with   Follow-up    No issues to discuss.    History of Present Illness   Kevin Davis is a 66 y.o. male presenting today for one year follow up. Last seen in 07/2021. History of chronic inactive Hep B, hepatic fibrosis, and colon polyps.   Patient briefly seen at hepatitis clinic by Dr. Adria Dill and Dr. Graylon Good with infectious diseases in 2014. Diagnosed with Hep B in 2010. Labs in 2014, he had a positive hepatitis B surface antigen, positive hepatitis Be antibody, undetectable hepatitis B DNA level, hepatitis B core antibody positive, hepatitis B surface antibody nonreactive, HIV NR.  After he was seen by our office in 2016 he had a repeat hepatitis B DNA level which was negative (May 2016).  Ultrasound with elastography June 2016.  Noted to have a stable left hepatic lobe hemangioma. Metavir fibrosis score of F2/F3.     Ruq u/s with elastrography 07/2021: slight nodularity of left lobe of liver. Median kPa 2.7.   Labs from August 2022: Hepatitis B E antigen negative, hepatitis B E antibody positive, hepatitis B DNA negative, hepatitis B surface antigen positive, hepatitis B surface antibody less than 3.1.  Hemoglobin 15.7, MCV 102, platelets 191,000, LFTs normal.  B12 is low at 131, folate low at 5.0.  Intrinsic factor level was 1.0 normal.  He was started on B12 injections monthly.  Started on folic acid 1 mg daily.  He was transitioned to oral B12 back in September 6333, folic acid was stopped after normal folate level returned.  From March 2023: Folate 8.5, vitamin B12 794.  Due to trend downward of folic acid we advised 545 IU daily.  Continue B12 oral supplement.  Colonoscopy June 2016, 2 tubular adenomas removed.  Advised 5-year surveillance  colonoscopy.  Today: he states he is currently etoh free for 40 days. Tends to take a beer break at the end of the year. No hard liquor. He does not usually drink daily when he is drinking. Maybe twice per week but can drink 3-7 twelve ounce beers when he does. Used an artificial denture glue recently and felt like it burned his kidneys and liver. Stopped three weeks ago and burning pain is improved. No dysuria or urinary frequency.  Cut back etoh at that time to see if helps. Complains of fatigue. BM regular. No melena, brbpr. No heartburn. No dysphagia. Stools look better on B12/folate. Currently uninsured, [place] assistance not active because he hasn't had bill to apply, reapplying today.   Medications   Current Outpatient Medications  Medication Sig Dispense Refill   cyanocobalamin (VITAMIN B12) 1000 MCG tablet Take 1,000 mcg by mouth daily.     folic acid (FOLVITE) 1 MG tablet Take 1 mg by mouth daily.     No current facility-administered medications for this visit.    Allergies   Allergies as of 09/21/2022   (No Known Allergies)     Past Medical History   Past Medical History:  Diagnosis Date   Basal cell carcinoma 2016   Basal cell carcinoma and dysplastic nevi removed same day, 1- lower abd , 1- upper L arm   Dysplastic nevus    Hepatitis B     Past Surgical History   Past Surgical History:  Procedure Laterality  Date   COLONOSCOPY N/A 05/26/2015   surgeon:  Daneil Dolin, MD;  Location: [facility];  Service: Endoscopy;  Laterality: N/A;  945   SKIN BIOPSY      Past Family History   Family History  Problem Relation Age of Onset   Cancer Mother        breast cancer   Alcohol abuse Father    Acute myelogenous leukemia Sister    Hepatitis B Sister    Stroke Maternal Grandfather    Pancreatic cancer Paternal Grandfather        died age 96   Lymphoma Cousin    Colon cancer Neg Hx     Past Social History   Social History   Socioeconomic History    Marital status: Single    Spouse name: Not on file   Number of children: Not on file   Years of education: Not on file   Highest education level: Not on file  Occupational History   Occupation: unemployed  Tobacco Use   Smoking status: Every Day    Packs/day: 1.50    Years: 31.00    Total pack years: 46.50    Types: Cigarettes   Smokeless tobacco: Never  Vaping Use   Vaping Use: Never used  Substance and Sexual Activity   Alcohol use: Not Currently    Comment: 2-3 times per week, 3-7 12 ounce beers. 09/2022.   Drug use: No   Sexual activity: Yes    Partners: Female  Other Topics Concern   Not on file  Social History Narrative   Not on file   Social Determinants of Health   Financial Resource Strain: Not on file  Food Insecurity: Not on file  Transportation Needs: Not on file  Physical Activity: Not on file  Stress: Not on file  Social Connections: Not on file  Intimate Partner Violence: Not on file    Review of Systems   General: Negative for anorexia, weight loss, fever, chills,  weakness. See hpi ENT: Negative for hoarseness, difficulty swallowing , nasal congestion. CV: Negative for chest pain, angina, palpitations, dyspnea on exertion, peripheral edema.  Respiratory: Negative for dyspnea at rest, dyspnea on exertion, cough, sputum, wheezing.  GI: See history of present illness. GU:  Negative for dysuria, hematuria, urinary incontinence, urinary frequency, nocturnal urination.  Endo: Negative for unusual weight change.     Physical Exam   BP 130/79 (BP Location: Right Arm, Patient Position: Sitting, Cuff Size: Large)   Pulse 62   Temp 97.9 F (36.6 C) (Oral)   Ht 6' (1.829 m)   Wt 198 lb 12.8 oz (90.2 kg)   SpO2 97%   BMI 26.96 kg/m    General: Well-nourished, well-developed in no acute distress.  Eyes: No icterus. Mouth: Oropharyngeal mucosa moist and pink , no lesions erythema or exudate. Lungs: Clear to auscultation bilaterally.  Heart: Regular  rate and rhythm, no murmurs rubs or gallops.  Abdomen: Bowel sounds are normal, nontender, nondistended, no hepatosplenomegaly or masses,  no abdominal bruits or hernia , no rebound or guarding.  Rectal: not performed Extremities: No lower extremity edema. No clubbing or deformities. Neuro: Alert and oriented x 4   Skin: Warm and dry, no jaundice.   Psych: Alert and cooperative, normal mood and affect.  Labs   Lab Results  Component Value Date   CREATININE 1.13 07/21/2021   BUN 16 07/21/2021   NA 136 07/21/2021   K 4.2 07/21/2021   CL 105 07/21/2021  CO2 25 07/21/2021   Lab Results  Component Value Date   ALT 13 07/21/2021   AST 18 07/21/2021   ALKPHOS 71 07/21/2021   BILITOT 0.9 07/21/2021   Lab Results  Component Value Date   WBC 7.4 07/21/2021   HGB 15.7 07/21/2021   HCT 46.6 07/21/2021   MCV 102.2 (H) 07/21/2021   PLT 191 07/21/2021   Lab Results  Component Value Date   FOLATE 8.5 03/02/2022   Lab Results  Component Value Date   VITAMINB12 794 03/02/2022    Imaging Studies   No results found.  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  LFTs have been normal.  Noted to have some hepatic fibrosis on elastography.  Last imaging 1 year ago.  Continue to follow labs.  Encouraged him to abstain from all alcohol, currently 40 days sober.  Upper abdominal burning: Noted after using artificial denture glue.  Switched to natural substance 3 weeks ago.  Also stopped alcohol use.  Symptoms improved.  We will continue to monitor.  B12/folate deficiency: Possibly related to alcohol misuse previously.  Due for recheck labs.  History of adenomatous colon polyps: overdue for surveillance colonoscopy.  He wants to see if he qualifies for [facility] health assistance prior to scheduling.  If he does not qualify he will be Medicare eligible February 2024 and wants to have it done at that  time.  He will keep [place] posted.   PLAN   CMET, lipase, CBC, B12, folate. Colonoscopy whenever he is ready to schedule. If qualifies for [facility] Assistance, we will try to complete before the end of the year. Continue T62 and folic acid supplements. Recommend avoiding all alcohol. Return office visit in 1 year or sooner if needed.   Laureen Ochs. Bobby Rumpf, [place], [facility]

## 2022-09-21 ENCOUNTER — Ambulatory Visit (INDEPENDENT_AMBULATORY_CARE_PROVIDER_SITE_OTHER): Payer: Self-pay | Admitting: Gastroenterology

## 2022-09-21 ENCOUNTER — Encounter: Payer: Self-pay | Admitting: Gastroenterology

## 2022-09-21 VITALS — BP 130/79 | HR 62 | Temp 97.9°F | Ht 72.0 in | Wt 198.8 lb

## 2022-09-21 DIAGNOSIS — R1010 Upper abdominal pain, unspecified: Secondary | ICD-10-CM | POA: Insufficient documentation

## 2022-09-21 DIAGNOSIS — B181 Chronic viral hepatitis B without delta-agent: Secondary | ICD-10-CM

## 2022-09-21 DIAGNOSIS — E538 Deficiency of other specified B group vitamins: Secondary | ICD-10-CM | POA: Insufficient documentation

## 2022-09-21 DIAGNOSIS — Z86010 Personal history of colonic polyps: Secondary | ICD-10-CM

## 2022-09-21 NOTE — Patient Instructions (Signed)
Please complete labs at your convenience. You can have done at the hospital. We will be in touch with results as available. You are overdue for a colonoscopy. As soon as you are ready to scheduled, please let us know.  Continue D62 and folic acid.  Would recommend avoiding alcohol given your liver issues. Really there is no considered safe amount when you have history of chronic inactive Hep B and liver fibrosis.  Return office visit in one year or sooner if needed.

## 2022-09-23 ENCOUNTER — Encounter: Payer: Self-pay | Admitting: Gastroenterology

## 2022-09-23 ENCOUNTER — Other Ambulatory Visit (HOSPITAL_COMMUNITY)
Admission: RE | Admit: 2022-09-23 | Discharge: 2022-09-23 | Disposition: A | Discharge status: Home / Self Care | Payer: Self-pay | Source: Ambulatory Visit | Attending: Gastroenterology | Admitting: Gastroenterology

## 2022-09-23 DIAGNOSIS — B181 Chronic viral hepatitis B without delta-agent: Secondary | ICD-10-CM | POA: Insufficient documentation

## 2022-09-23 LAB — CBC WITH DIFFERENTIAL/PLATELET
Abs Immature Granulocytes: 0.02 10*3/uL (ref 0.00–0.07)
Basophils Absolute: 0.1 10*3/uL (ref 0.0–0.1)
Basophils Relative: 2 %
Eosinophils Absolute: 0.2 10*3/uL (ref 0.0–0.5)
Eosinophils Relative: 3 %
HCT: 48 % (ref 39.0–52.0)
Hemoglobin: 16.7 g/dL (ref 13.0–17.0)
Immature Granulocytes: 0 %
Lymphocytes Relative: 43 %
Lymphs Abs: 3 10*3/uL (ref 0.7–4.0)
MCH: 34.8 pg — ABNORMAL HIGH (ref 26.0–34.0)
MCHC: 34.8 g/dL (ref 30.0–36.0)
MCV: 100 fL (ref 80.0–100.0)
Monocytes Absolute: 1 10*3/uL (ref 0.1–1.0)
Monocytes Relative: 14 %
Neutro Abs: 2.6 10*3/uL (ref 1.7–7.7)
Neutrophils Relative %: 38 %
Platelets: 198 10*3/uL (ref 150–400)
RBC: 4.8 MIL/uL (ref 4.22–5.81)
RDW: 13.2 % (ref 11.5–15.5)
WBC: 6.9 10*3/uL (ref 4.0–10.5)
nRBC: 0 % (ref 0.0–0.2)

## 2022-09-23 LAB — COMPREHENSIVE METABOLIC PANEL
ALT: 18 U/L (ref 0–44)
AST: 16 U/L (ref 15–41)
Albumin: 3.9 g/dL (ref 3.5–5.0)
Alkaline Phosphatase: 59 U/L (ref 38–126)
Anion gap: 7 (ref 5–15)
BUN: 15 mg/dL (ref 8–23)
CO2: 24 mmol/L (ref 22–32)
Calcium: 9.5 mg/dL (ref 8.9–10.3)
Chloride: 108 mmol/L (ref 98–111)
Creatinine, Ser: 1.36 mg/dL — ABNORMAL HIGH (ref 0.61–1.24)
GFR, Estimated: 58 mL/min — ABNORMAL LOW (ref >60)
Glucose, Bld: 100 mg/dL — ABNORMAL HIGH (ref 70–99)
Potassium: 4.2 mmol/L (ref 3.5–5.1)
Sodium: 139 mmol/L (ref 135–145)
Total Bilirubin: 1 mg/dL (ref 0.3–1.2)
Total Protein: 6.8 g/dL (ref 6.5–8.1)

## 2022-09-23 LAB — FOLATE: Folate: >40 ng/mL (ref >5.9)

## 2022-09-23 LAB — VITAMIN B12: Vitamin B-12: 819 pg/mL (ref 180–914)

## 2022-09-23 LAB — LIPASE, BLOOD: Lipase: 51 U/L (ref 11–51)

## 2022-09-29 ENCOUNTER — Other Ambulatory Visit: Payer: Self-pay

## 2022-09-29 DIAGNOSIS — B181 Chronic viral hepatitis B without delta-agent: Secondary | ICD-10-CM

## 2022-09-29 DIAGNOSIS — E538 Deficiency of other specified B group vitamins: Secondary | ICD-10-CM

## 2022-10-05 ENCOUNTER — Other Ambulatory Visit: Payer: Self-pay

## 2022-10-05 DIAGNOSIS — B181 Chronic viral hepatitis B without delta-agent: Secondary | ICD-10-CM

## 2022-10-05 DIAGNOSIS — E538 Deficiency of other specified B group vitamins: Secondary | ICD-10-CM

## 2022-10-08 ENCOUNTER — Telehealth: Payer: Self-pay

## 2022-10-08 NOTE — Telephone Encounter (Signed)
Patient called and is ready to schedule his colonoscopy.

## 2022-10-12 NOTE — Telephone Encounter (Signed)
Ok to schedule colonoscopy for h/o adenomatous colon polyps.  Schedule with Dr. Gala Romney. ASA 2.

## 2022-10-12 NOTE — Telephone Encounter (Signed)
We will call pt to schedule once we get December schedule

## 2022-10-26 NOTE — Telephone Encounter (Signed)
LMVOM to call back to schedule

## 2022-10-27 ENCOUNTER — Encounter: Payer: Self-pay | Admitting: *Deleted

## 2022-10-27 ENCOUNTER — Other Ambulatory Visit (HOSPITAL_COMMUNITY)
Admission: RE | Admit: 2022-10-27 | Discharge: 2022-10-27 | Disposition: A | Discharge status: Home / Self Care | Payer: Self-pay | Source: Ambulatory Visit | Attending: Gastroenterology | Admitting: Gastroenterology

## 2022-10-27 DIAGNOSIS — B181 Chronic viral hepatitis B without delta-agent: Secondary | ICD-10-CM | POA: Insufficient documentation

## 2022-10-27 LAB — BASIC METABOLIC PANEL
Anion gap: 7 (ref 5–15)
BUN: 16 mg/dL (ref 8–23)
CO2: 24 mmol/L (ref 22–32)
Calcium: 9.2 mg/dL (ref 8.9–10.3)
Chloride: 106 mmol/L (ref 98–111)
Creatinine, Ser: 1.3 mg/dL — ABNORMAL HIGH (ref 0.61–1.24)
GFR, Estimated: >60 mL/min (ref >60)
Glucose, Bld: 102 mg/dL — ABNORMAL HIGH (ref 70–99)
Potassium: 3.8 mmol/L (ref 3.5–5.1)
Sodium: 137 mmol/L (ref 135–145)

## 2022-10-27 MED ORDER — NA SULFATE-K SULFATE-MG SULF 17.5-3.13-1.6 GM/177ML PO SOLN: ORAL | 0 refills | Status: DC

## 2022-10-27 NOTE — Telephone Encounter (Signed)
Pt has been scheduled for 11/29/22 at 8 am. Instructions mailed and prep sent to the pharmacy.

## 2022-11-03 ENCOUNTER — Encounter: Payer: Self-pay | Admitting: Physician Assistant

## 2022-11-03 ENCOUNTER — Ambulatory Visit: Payer: Self-pay | Admitting: Physician Assistant

## 2022-11-03 VITALS — BP 110/70 | HR 61 | Temp 97.7°F | Ht 72.0 in | Wt 200.1 lb

## 2022-11-03 DIAGNOSIS — R7989 Other specified abnormal findings of blood chemistry: Secondary | ICD-10-CM

## 2022-11-03 DIAGNOSIS — F17200 Nicotine dependence, unspecified, uncomplicated: Secondary | ICD-10-CM

## 2022-11-03 LAB — POCT URINALYSIS DIPSTICK
Glucose, UA: NEGATIVE
Ketones, UA: NEGATIVE
Leukocytes, UA: NEGATIVE
Nitrite, UA: NEGATIVE
Protein, UA: POSITIVE — AB
Spec Grav, UA: 1.025 (ref 1.010–1.025)
Urobilinogen, UA: 0.2 E.U./dL
pH, UA: 5 (ref 5.0–8.0)

## 2022-11-03 NOTE — Progress Notes (Signed)
BP 110/70   Pulse 61   Temp 97.7 F (36.5 C)   Ht 6' (1.829 m)   Wt 200 lb 1.6 oz (90.8 kg)   SpO2 99%   BMI 27.14 kg/m    Subjective:    Patient ID: Kevin Davis, male    DOB: [birth year], 66 y.o.   MRN: [id number]  HPI: Kevin Davis is a 66 y.o. male presenting on 11/03/2022 for Follow-up (Kidney function)   HPI   Chief Complaint  Patient presents with   Follow-up    Kidney function      Pt is in today after labs done by GI showed elevated creatinine.  He doesn't work currently He Smokes 1 1/2ppd He has history heavy etoh.  He says his last drink was last Thursday.  Drinks 6 oz gin which is equivalent of 4 drinks. He says he drinks a gallon a day now since being today creatinine is elevated   He doesn't use OTC analgesics/NSAIDs  He used a lot of polygrip in September and he thinks that made his kidneys hurt.    Relevant past medical, surgical, family and social history reviewed and updated as indicated. Interim medical history since our last visit reviewed. Allergies and medications reviewed and updated.    CURRENT MEDS: Vitamin b-12   Review of Systems  Per HPI unless specifically indicated above     Objective:    BP 110/70   Pulse 61   Temp 97.7 F (36.5 C)   Ht 6' (1.829 m)   Wt 200 lb 1.6 oz (90.8 kg)   SpO2 99%   BMI 27.14 kg/m   Wt Readings from Last 3 Encounters:  11/03/22 200 lb 1.6 oz (90.8 kg)  09/21/22 198 lb 12.8 oz (90.2 kg)  04/20/22 198 lb (89.8 kg)    Physical Exam Vitals reviewed.  Constitutional:      General: He is not in acute distress.    Appearance: He is well-developed. He is not toxic-appearing.     Comments: Very strong tobacco smoke odor  HENT:     Head: Normocephalic and atraumatic.  Cardiovascular:     Rate and Rhythm: Normal rate and regular rhythm.  Pulmonary:     Effort: Pulmonary effort is normal.     Breath sounds: Normal breath sounds. No wheezing.  Abdominal:     General: Bowel sounds are  normal.     Palpations: Abdomen is soft.     Tenderness: There is no abdominal tenderness. There is no right CVA tenderness or left CVA tenderness.  Musculoskeletal:     Cervical back: Neck supple.     Right lower leg: No edema.     Left lower leg: No edema.  Lymphadenopathy:     Cervical: No cervical adenopathy.  Skin:    General: Skin is warm and dry.  Neurological:     Mental Status: He is alert and oriented to person, place, and time.  Psychiatric:        Behavior: Behavior normal.    Urine tea colored/concentrated       Assessment & Plan:   Encounter Diagnoses  Name Primary?   Elevated serum creatinine Yes   Tobacco use disorder        -reviewed recent labs.  Reassured pt that .Cr 1.3 is jus out of range and GFR is within normal range.    UA done today  UA not consistent with drinking a gallon of water daily and pt admits that  he is no longer drinking a gallon of water every day.  Pt is encouraged to drink 5 bottle water/day.  He is given some reading information on healthy kidney lifestyle -encouraged smoking cessation or at least reduction in smoking -pt to follow up 6 weeks to recheck Cr .  He is to contact office sooner prn

## 2022-11-03 NOTE — Patient Instructions (Signed)
Preventing Chronic Kidney Disease Chronic kidney disease (CKD) occurs when the kidneys are slowly and permanently damaged over a long period of time. The kidneys are two organs that do many important jobs in the body, including: Removing waste and extra fluid from the blood to make urine. Making hormones that maintain the amount of fluid in tissues and blood vessels. Maintaining the right amount of fluids and electrolytes in the body. A small amount of kidney damage may not cause problems, but a large amount of damage may make it hard or impossible for the kidneys to work the way they should. CKD gets worse over time. You can take steps to prevent CKD or to keep it from getting worse. The best way to prevent kidney damage is to know your risk factors and make changes before you develop symptoms of CKD. How can this condition affect me? At first, you may not notice any signs or symptoms of CKD. Symptoms develop slowly and may not be obvious until the kidney damage becomes severe. If steps are not taken to prevent or slow down the disease, CKD can lead to: A low red blood cell count (anemia). Heart disease. Weak bones. Nerve damage. Stroke. Kidney failure and dialysis. Changes in urination, such as less urine, more urine, or blood in the urine. What can increase my risk? You are more likely to develop CKD if you: Are 53 years of age or older. Are obese. Have taken certain medicines for a long time. Use tobacco or have used it in the past. Have any of the following conditions: Diabetes. High blood pressure. Heart disease. Multiple myeloma. An autoimmune disease. Frequent urinary tract infections. Polycystic kidney disease. High cholesterol. Have a family history of kidney disease, heart disease, diabetes, or high blood pressure. Have problems with urine flow that may be caused by: Cancer. Having kidney stones more than once. An enlarged prostate in males. What actions can I take to  prevent CKD? Managing conditions that put you at risk Talk to your health care provider about your kidney health and your risk factors for CKD. Work with your health care provider to manage conditions such as high blood pressure, diabetes, or high cholesterol. This may involve taking medicines, eating healthy, or making lifestyle changes to help get the following measures down to the target that your health care provider recommends: Blood pressure. Blood sugar (glucose) levels. Cholesterol. Eating and drinking  Follow instructions from your health care provider about diet. This may include: Limiting salt (sodium) intake. You should have less than 1 tsp (2,300 mg) of sodium a day. If you have heart disease or high blood pressure, you should have less than  tsp (1,725 mg) of sodium a day. Limiting protein intake as told by your health care provider. Avoid high-protein foods. Eating a balanced, heart-healthy diet. Avoiding foods that are high in potassium and phosphorous. Limit alcohol. If you drink alcohol: Limit how much you use to: 0-1 drink a day for women who are not pregnant. 0-2 drinks a day for men. Know how much alcohol is in your drink. In the [place] one drink equals one 12 oz bottle of beer (355 mL), one 5 oz glass of wine (148 mL), or one 1 oz glass of hard liquor (44 mL). If you have diabetes, work with a Financial planner (Firefighter) or a certified diabetes educator to develop a healthy eating plan. Talk with your health care provider about how much fluid you should drink each day. Lifestyle  Exercise  for at least 30 minutes on 5 or more days of the week, or as much as told by your health care provider. Keep your weight at a healthy level. If you are overweight or obese, lose weight as told by your health care provider. Do not use any products that contain nicotine or tobacco, such as cigarettes, e-cigarettes, and chewing tobacco. If you need help quitting, ask your  health care provider. General instructions Take over-the-counter and prescription medicines only as told by your health care provider. Do not take any new medicines unless approved by your health care provider. Use NSAIDs, such as ibuprofen, for pain only when necessary. Ask your health care provider about other pain medicines that do not increase your risk of developing CKD. Have a yearly physical exam. Learn about your family's medical history. Talk to your relatives and siblings about diabetes, heart disease, and high blood pressure. Where to find more information Learn more about CKD and how to prevent CKD from: [place]: www.kidney.org American Association of Kidney Patients: BombTimer.gl American Diabetes Association: www.diabetes.org Summary Symptoms of CKD develop slowly and may not be obvious until the kidney damage becomes severe. The best way to prevent kidney damage is to know your risk factors and make nutrition and lifestyle changes before you develop symptoms of CKD. Follow instructions from your health care provider about diet, which may include limiting how much salt, protein, and alcohol you consume. Work with your health care provider to keep your blood pressure, cholesterol, and blood sugar levels within the recommended range. This information is not intended to replace advice given to you by your health care provider. Make sure you discuss any questions you have with your health care provider. Document Revised: 04/03/2020 Document Reviewed: 03/07/2020 Elsevier Patient Education  [place].

## 2022-11-05 ENCOUNTER — Other Ambulatory Visit: Payer: Self-pay | Admitting: *Deleted

## 2022-11-05 ENCOUNTER — Encounter: Payer: Self-pay | Admitting: *Deleted

## 2022-11-05 MED ORDER — PEG 3350-KCL-NA BICARB-NACL 420 G PO SOLR: 4000.0000 mL | Freq: Once | ORAL | 0 refills | Status: AC

## 2022-11-05 NOTE — Telephone Encounter (Signed)
Pt left voicemail stating the prep that was sent in was $90 and he wanted the "generic prep" to be sent in that would only cost $40. Trilyte was sent in.  LMOVM with pt identifying himself, that new prep was sent in and new instructions mailed. If any questions to give me a call.

## 2022-11-25 ENCOUNTER — Telehealth: Payer: Self-pay | Admitting: *Deleted

## 2022-11-25 NOTE — Telephone Encounter (Signed)
Pt was concerned about the prep messing with him and his water diet. Advised pt when he starts drinking the prep to make sure that he drinks plenty of water while he is doing it. Pt verbalized understanding.

## 2022-11-29 ENCOUNTER — Ambulatory Visit (HOSPITAL_COMMUNITY): Payer: Self-pay | Admitting: Certified Registered Nurse Anesthetist

## 2022-11-29 ENCOUNTER — Encounter (HOSPITAL_COMMUNITY)
Admission: RE | Disposition: A | Discharge status: Home / Self Care | Payer: Self-pay | Source: Home / Self Care | Attending: Internal Medicine

## 2022-11-29 ENCOUNTER — Other Ambulatory Visit: Payer: Self-pay

## 2022-11-29 ENCOUNTER — Encounter (HOSPITAL_COMMUNITY): Payer: Self-pay | Admitting: Internal Medicine

## 2022-11-29 ENCOUNTER — Ambulatory Visit (HOSPITAL_BASED_OUTPATIENT_CLINIC_OR_DEPARTMENT_OTHER): Payer: Self-pay | Admitting: Certified Registered Nurse Anesthetist

## 2022-11-29 ENCOUNTER — Ambulatory Visit (HOSPITAL_COMMUNITY)
Admission: RE | Admit: 2022-11-29 | Discharge: 2022-11-29 | Disposition: A | Discharge status: Home / Self Care | Payer: Self-pay | Attending: Internal Medicine | Admitting: Internal Medicine

## 2022-11-29 DIAGNOSIS — Z09 Encounter for follow-up examination after completed treatment for conditions other than malignant neoplasm: Secondary | ICD-10-CM

## 2022-11-29 DIAGNOSIS — K5730 Diverticulosis of large intestine without perforation or abscess without bleeding: Secondary | ICD-10-CM | POA: Insufficient documentation

## 2022-11-29 DIAGNOSIS — D127 Benign neoplasm of rectosigmoid junction: Secondary | ICD-10-CM

## 2022-11-29 DIAGNOSIS — Z86010 Personal history of colonic polyps: Secondary | ICD-10-CM | POA: Insufficient documentation

## 2022-11-29 DIAGNOSIS — K635 Polyp of colon: Secondary | ICD-10-CM | POA: Insufficient documentation

## 2022-11-29 DIAGNOSIS — D122 Benign neoplasm of ascending colon: Secondary | ICD-10-CM | POA: Insufficient documentation

## 2022-11-29 DIAGNOSIS — D124 Benign neoplasm of descending colon: Secondary | ICD-10-CM

## 2022-11-29 DIAGNOSIS — F17210 Nicotine dependence, cigarettes, uncomplicated: Secondary | ICD-10-CM | POA: Insufficient documentation

## 2022-11-29 HISTORY — PX: POLYPECTOMY: SHX5525

## 2022-11-29 HISTORY — PX: COLONOSCOPY WITH PROPOFOL: SHX5780

## 2022-11-29 SURGERY — COLONOSCOPY WITH PROPOFOL
Anesthesia: General

## 2022-11-29 MED ORDER — PROPOFOL 10 MG/ML IV BOLUS
INTRAVENOUS | Status: DC | PRN
  Administered 2022-11-29: 60 mg via INTRAVENOUS

## 2022-11-29 MED ORDER — PROPOFOL 500 MG/50ML IV EMUL
INTRAVENOUS | Status: AC
  Filled 2022-11-29: qty 50

## 2022-11-29 MED ORDER — PROPOFOL 500 MG/50ML IV EMUL
INTRAVENOUS | Status: DC | PRN
  Administered 2022-11-29: 150 ug/kg/min via INTRAVENOUS

## 2022-11-29 MED ORDER — LACTATED RINGERS IV SOLN: INTRAVENOUS | Status: DC

## 2022-11-29 MED ORDER — PHENYLEPHRINE 80 MCG/ML (10ML) SYRINGE FOR IV PUSH (FOR BLOOD PRESSURE SUPPORT)
PREFILLED_SYRINGE | INTRAVENOUS | Status: AC
  Filled 2022-11-29: qty 10

## 2022-11-29 MED ORDER — PHENYLEPHRINE HCL (PRESSORS) 10 MG/ML IV SOLN
INTRAVENOUS | Status: DC | PRN
  Administered 2022-11-29 (×2): 80 ug via INTRAVENOUS

## 2022-11-29 NOTE — Op Note (Signed)
[facility] Patient Name: Kevin Davis Procedure Date: 11/29/2022 8:01 AM MRN: [id number] Date of Birth: [birth year] Attending MD: Norvel Richards , MD, [id number] CSN: [id number] Age: 66 Admit Type: Outpatient Procedure:                Colonoscopy Indications:              High risk colon cancer surveillance: Personal                            history of colonic polyps Providers:                Norvel Richards, MD, Charlsie Quest. Theda Sers RN, RN,                            Ladoris Gene Technician, Technician Referring MD:             Norvel Richards, MD Medicines:                Propofol per Anesthesia Complications:            No immediate complications. Estimated Blood Loss:     Estimated blood loss was minimal. Procedure:                Pre-Anesthesia Assessment:                           - Prior to the procedure, a History and Physical                            was performed, and patient medications and                            allergies were reviewed. The patient's tolerance of                            previous anesthesia was also reviewed. The risks                            and benefits of the procedure and the sedation                            options and risks were discussed with the patient.                            All questions were answered, and informed consent                            was obtained. Prior Anticoagulants: The patient has                            taken no anticoagulant or antiplatelet agents. ASA                            Grade Assessment: II - A patient with mild systemic  disease. After reviewing the risks and benefits,                            the patient was deemed in satisfactory condition to                            undergo the procedure.                           After obtaining informed consent, the colonoscope                            was passed under direct vision. Throughout the                             procedure, the patient's blood pressure, pulse, and                            oxygen saturations were monitored continuously. The                            [id number]) scope was introduced through                            the anus and advanced to the the cecum, identified                            by appendiceal orifice and ileocecal valve. The                            colonoscopy was performed without difficulty. The                            patient tolerated the procedure well. The quality                            of the bowel preparation was adequate. The                            ileocecal valve, appendiceal orifice, and rectum                            were photographed. Scope In: 8:24:13 AM Scope Out: 8:41:11 AM Scope Withdrawal Time: 0 hours 11 minutes 15 seconds  Total Procedure Duration: 0 hours 16 minutes 58 seconds  Findings:      The perianal and digital rectal examinations were normal.      Scattered medium-mouthed diverticula were found in the sigmoid colon and       descending colon.      Six semi-pedunculated polyps were found in the recto-sigmoid colon,       descending colon, ascending colon and cecum. The polyps were 2 to 7 mm       in size. These polyps were removed with a cold snare. Resection and       retrieval were complete. Estimated blood loss was  minimal. Very small 2       mm polyp in the base the cecum was resected but not recovered. Rectal       mucosa seen well on face. Rectal vault too small to retroflex. Other       than a couple of mamallations , the rectal mucosa appeared entirely       normal      The exam was otherwise without abnormality. Impression:               - Diverticulosis in the sigmoid colon and in the                            descending colon.                           - Six 2 to 7 mm polyps at the recto-sigmoid colon,                            in the descending colon, in the ascending colon  and                            in the cecum, removed with a cold snare. Resected                            and retrieved.                           - The examination was otherwise normal. Moderate Sedation:      Moderate (conscious) sedation was personally administered by an       anesthesia professional. The following parameters were monitored: oxygen       saturation, heart rate, blood pressure, respiratory rate, EKG, adequacy       of pulmonary ventilation, and response to care. Recommendation:           - Patient has a contact number available for                            emergencies. The signs and symptoms of potential                            delayed complications were discussed with the                            patient. Return to normal activities tomorrow.                            Written discharge instructions were provided to the                            patient.                           - Advance diet as tolerated.                           - Continue present medications. Follow-up  on                            pathology.                           - Repeat colonoscopy date to be determined after                            pending pathology results are reviewed for                            surveillance.                           - Return to GI office (date not yet determined). Procedure Code(s):        --- Professional ---                           [phone], Colonoscopy, flexible; with removal of                            tumor(s), polyp(s), or other lesion(s) by snare                            technique Diagnosis Code(s):        --- Professional ---                           Z86.010, Personal history of colonic polyps                           D12.7, Benign neoplasm of rectosigmoid junction                           D12.4, Benign neoplasm of descending colon                           D12.2, Benign neoplasm of ascending colon                           D12.0,  Benign neoplasm of cecum                           K57.30, Diverticulosis of large intestine without                            perforation or abscess without bleeding CPT copyright 2022 American Medical Association. All rights reserved. The codes documented in this report are preliminary and upon coder review may  be revised to meet current compliance requirements. Cristopher Estimable. Lovell Nuttall, MD Norvel Richards, MD 11/29/2022 8:54:08 AM This report has been signed electronically. Number of Addenda: 0

## 2022-11-29 NOTE — Anesthesia Procedure Notes (Signed)
Date/Time: 11/29/2022 8:21 AM  Performed by: Maude Leriche, CRNAPre-anesthesia Checklist: Patient identified, Emergency Drugs available, Suction available, Patient being monitored and Timeout performed Patient Re-evaluated:Patient Re-evaluated prior to induction Oxygen Delivery Method: Nasal cannula Induction Type: IV induction Placement Confirmation: positive ETCO2 Dental Injury: Teeth and Oropharynx as per pre-operative assessment

## 2022-11-29 NOTE — Anesthesia Postprocedure Evaluation (Signed)
Anesthesia Post Note  Patient: Kevin Davis  Procedure(s) Performed: COLONOSCOPY WITH PROPOFOL POLYPECTOMY  Patient location during evaluation: Phase II Anesthesia Type: General Level of consciousness: awake Pain management: pain level controlled Vital Signs Assessment: post-procedure vital signs reviewed and stable Respiratory status: spontaneous breathing and respiratory function stable Cardiovascular status: blood pressure returned to baseline and stable Postop Assessment: no headache and no apparent nausea or vomiting Anesthetic complications: no Comments: Late entry   No notable events documented.   Last Vitals:  Vitals:   11/29/22 0844 11/29/22 0851  BP: (!) 91/52 (!) 91/53  Pulse: (!) 59   Resp: 13   Temp: 36.4 C   SpO2: 96%     Last Pain:  Vitals:   11/29/22 0851  TempSrc:   PainSc: 0-No pain                 [name]

## 2022-11-29 NOTE — H&P (Signed)
$'@LOGO'o$ @   Primary Care Physician:  Soyla Dryer, PA-C Primary Gastroenterologist:  Dr. Gala Romney  Pre-Procedure History & Physical: HPI:  Kevin Davis is a 66 y.o. male here for Surveillance colonoscopy.  History of 2 adenomatous polyps removed from his colon 2016.     No bowel symptoms.  Past Medical History:  Diagnosis Date   Basal cell carcinoma 2016   Basal cell carcinoma and dysplastic nevi removed same day, 1- lower abd , 1- upper L arm   Dysplastic nevus    Hepatitis B     Past Surgical History:  Procedure Laterality Date   COLONOSCOPY N/A 05/26/2015   surgeon:  Daneil Dolin, MD;  Location: [facility];  Service: Endoscopy;  Laterality: N/A;  945   SKIN BIOPSY      Prior to Admission medications   Medication Sig Start Date End Date Taking? Authorizing Provider  cyanocobalamin (VITAMIN B12) 1000 MCG tablet Take 1,000 mcg by mouth daily.   Yes [provider]  Na Sulfate-K Sulfate-Mg Sulf 17.5-3.13-1.6 GM/177ML SOLN As directed Patient not taking: Reported on 11/03/2022 10/27/22   Ryonna Cimini, Cristopher Estimable, MD    Allergies as of 10/27/2022   (No Known Allergies)    Family History  Problem Relation Age of Onset   Cancer Mother        breast cancer   Alcohol abuse Father    Acute myelogenous leukemia Sister    Hepatitis B Sister    Stroke Maternal Grandfather    Pancreatic cancer Paternal Grandfather        died age 65   Lymphoma Cousin    Colon cancer Neg Hx     Social History   Socioeconomic History   Marital status: Single    Spouse name: Not on file   Number of children: Not on file   Years of education: Not on file   Highest education level: Not on file  Occupational History   Occupation: unemployed  Tobacco Use   Smoking status: Every Day    Packs/day: 1.50    Years: 31.00    Total pack years: 46.50    Types: Cigarettes   Smokeless tobacco: Never  Vaping Use   Vaping Use: Never used  Substance and Sexual Activity   Alcohol use: Yes     Comment: 1 pint/week.   Drug use: No   Sexual activity: Yes    Partners: Female  Other Topics Concern   Not on file  Social History Narrative   Not on file   Social Determinants of Health   Financial Resource Strain: Not on file  Food Insecurity: Not on file  Transportation Needs: Not on file  Physical Activity: Not on file  Stress: Not on file  Social Connections: Not on file  Intimate Partner Violence: Not on file    Review of Systems: See HPI, otherwise negative ROS  Physical Exam: BP 117/71   Pulse (!) 58   Temp 97.7 F (36.5 C)   Resp 17   Ht 6' (1.829 m)   Wt 89.4 kg   SpO2 98%   BMI 26.72 kg/m  General:   Alert,  Well-developed, well-nourished, pleasant and cooperative in NAD SNeck:  Supple; no masses or thyromegaly. No significant cervical adenopathy. Lungs:  Clear throughout to auscultation.   No wheezes, crackles, or rhonchi. No acute distress. Heart:  Regular rate and rhythm; no murmurs, clicks, rubs,  or gallops. Abdomen: Non-distended, normal bowel sounds.  Soft and nontender without appreciable mass  or hepatosplenomegaly.  Pulses:  Normal pulses noted. Extremities:  Without clubbing or edema.  Impression/Plan:    66 year old gentleman   With a history of colonic adenoma.  He is here for surveillance colonoscopy per plan. The risks, benefits, limitations, alternatives and imponderables have been reviewed with the patient. Questions have been answered. All parties are agreeable.       Notice: This dictation was prepared with Dragon dictation along with smaller phrase technology. Any transcriptional errors that result from this process are unintentional and may not be corrected upon review.

## 2022-11-29 NOTE — Anesthesia Preprocedure Evaluation (Signed)
Anesthesia Evaluation  Patient identified by MRN, date of birth, ID band Patient awake    Reviewed: Allergy & Precautions, H&P , NPO status , Patient's Chart, lab work & pertinent test results, reviewed documented beta blocker date and time   Airway Mallampati: II  TM Distance: >3 FB Neck ROM: full    Dental no notable dental hx.    Pulmonary neg pulmonary ROS, Current Smoker   Pulmonary exam normal breath sounds clear to auscultation       Cardiovascular Exercise Tolerance: Good negative cardio ROS  Rhythm:regular Rate:Normal     Neuro/Psych negative neurological ROS  negative psych ROS   GI/Hepatic negative GI ROS,,,(+) Hepatitis -, B  Endo/Other  negative endocrine ROS    Renal/GU negative Renal ROS  negative genitourinary   Musculoskeletal   Abdominal   Peds  Hematology negative hematology ROS (+)   Anesthesia Other Findings   Reproductive/Obstetrics negative OB ROS                             Anesthesia Physical Anesthesia Plan  ASA: 2  Anesthesia Plan: General   Post-op Pain Management:    Induction:   PONV Risk Score and Plan: Propofol infusion  Airway Management Planned:   Additional Equipment:   Intra-op Plan:   Post-operative Plan:   Informed Consent: I have reviewed the patients History and Physical, chart, labs and discussed the procedure including the risks, benefits and alternatives for the proposed anesthesia with the patient or authorized representative who has indicated his/her understanding and acceptance.     Dental Advisory Given  Plan Discussed with: CRNA  Anesthesia Plan Comments:        Anesthesia Quick Evaluation

## 2022-11-29 NOTE — Transfer of Care (Signed)
Immediate Anesthesia Transfer of Care Note  Patient: Kevin Davis  Procedure(s) Performed: COLONOSCOPY WITH PROPOFOL POLYPECTOMY  Patient Location: PACU  Anesthesia Type:General  Level of Consciousness: awake, alert , drowsy, patient cooperative, and responds to stimulation  Airway & Oxygen Therapy: Patient Spontanous Breathing  Post-op Assessment: Report given to RN, Post -op Vital signs reviewed and stable, Patient moving all extremities X 4, and Patient able to stick tongue midline  Post vital signs: Reviewed  Last Vitals:  Vitals Value Taken Time  BP 91/52   Temp 98.1   Pulse 96   Resp 14   SpO2 60     Last Pain:  Vitals:   11/29/22 0824  TempSrc:   PainSc: 0-No pain      Patients Stated Pain Goal: 7 (50/41/36 4383)  Complications: No notable events documented.

## 2022-11-29 NOTE — Discharge Instructions (Addendum)
  Colonoscopy Discharge Instructions  Read the instructions outlined below and refer to this sheet in the next few weeks. These discharge instructions provide you with general information on caring for yourself after you leave the hospital. Your doctor may also give you specific instructions. While your treatment has been planned according to the most current medical practices available, unavoidable complications occasionally occur. If you have any problems or questions after discharge, call Dr. Gala Romney at [phone]. ACTIVITY You may resume your regular activity, but move at a slower pace for the next 24 hours.  Take frequent rest periods for the next 24 hours.  Walking will help get rid of the air and reduce the bloated feeling in your belly (abdomen).  No driving for 24 hours (because of the medicine (anesthesia) used during the test).   Do not sign any important legal documents or operate any machinery for 24 hours (because of the anesthesia used during the test).  NUTRITION Drink plenty of fluids.  You may resume your normal diet as instructed by your doctor.  Begin with a light meal and progress to your normal diet. Heavy or fried foods are harder to digest and may make you feel sick to your stomach (nauseated).  Avoid alcoholic beverages for 24 hours or as instructed.  MEDICATIONS You may resume your normal medications unless your doctor tells you otherwise.  WHAT YOU CAN EXPECT TODAY Some feelings of bloating in the abdomen.  Passage of more gas than usual.  Spotting of blood in your stool or on the toilet paper.  IF YOU HAD POLYPS REMOVED DURING THE COLONOSCOPY: No aspirin products for 7 days or as instructed.  No alcohol for 7 days or as instructed.  Eat a soft diet for the next 24 hours.  FINDING OUT THE RESULTS OF YOUR TEST Not all test results are available during your visit. If your test results are not back during the visit, make an appointment with your caregiver to find out the  results. Do not assume everything is normal if you have not heard from your caregiver or the medical facility. It is important for you to follow up on all of your test results.  SEEK IMMEDIATE MEDICAL ATTENTION IF: You have more than a spotting of blood in your stool.  Your belly is swollen (abdominal distention).  You are nauseated or vomiting.  You have a temperature over 101.  You have abdominal pain or discomfort that is severe or gets worse throughout the day.      6 polyps removed from your colon-they were not very big   further recommendations to follow pending review of pathology report  Colon polyp and diverticulosis information provided  At patient request, I called Estell Harpin -[phone] -  no answer

## 2022-11-30 ENCOUNTER — Encounter: Payer: Self-pay | Admitting: Internal Medicine

## 2022-11-30 LAB — SURGICAL PATHOLOGY

## 2022-12-06 ENCOUNTER — Encounter (HOSPITAL_COMMUNITY): Payer: Self-pay | Admitting: Internal Medicine

## 2022-12-08 ENCOUNTER — Other Ambulatory Visit: Payer: Self-pay | Admitting: Physician Assistant

## 2022-12-08 DIAGNOSIS — R7989 Other specified abnormal findings of blood chemistry: Secondary | ICD-10-CM

## 2022-12-27 ENCOUNTER — Other Ambulatory Visit (HOSPITAL_COMMUNITY)
Admission: RE | Admit: 2022-12-27 | Discharge: 2022-12-27 | Disposition: A | Discharge status: Home / Self Care | Payer: Self-pay | Source: Ambulatory Visit | Attending: Physician Assistant | Admitting: Physician Assistant

## 2022-12-27 DIAGNOSIS — R7989 Other specified abnormal findings of blood chemistry: Secondary | ICD-10-CM | POA: Insufficient documentation

## 2022-12-27 LAB — BASIC METABOLIC PANEL
Anion gap: 10 (ref 5–15)
BUN: 14 mg/dL (ref 8–23)
CO2: 22 mmol/L (ref 22–32)
Calcium: 9.1 mg/dL (ref 8.9–10.3)
Chloride: 101 mmol/L (ref 98–111)
Creatinine, Ser: 1.23 mg/dL (ref 0.61–1.24)
GFR, Estimated: >60 mL/min (ref >60)
Glucose, Bld: 94 mg/dL (ref 70–99)
Potassium: 3.4 mmol/L — ABNORMAL LOW (ref 3.5–5.1)
Sodium: 133 mmol/L — ABNORMAL LOW (ref 135–145)

## 2022-12-28 ENCOUNTER — Ambulatory Visit: Payer: Self-pay | Admitting: Physician Assistant

## 2022-12-28 ENCOUNTER — Encounter: Payer: Self-pay | Admitting: Physician Assistant

## 2022-12-28 DIAGNOSIS — F17200 Nicotine dependence, unspecified, uncomplicated: Secondary | ICD-10-CM

## 2022-12-28 NOTE — Progress Notes (Signed)
   There were no vitals taken for this visit.   Subjective:    Patient ID: Kevin Davis, male    DOB: 1956/04/27, 67 y.o.   MRN: [id number]  HPI: Kevin Davis is a 67 y.o. male presenting on 12/28/2022 for No chief complaint on file.   HPI  This is a telemedicine appointment through [employer].  Pt was scheduled for in-person appointment but called earlier today to request change due to the weather.  I connected with  Kevin Davis on 12/28/22 by a video enabled telemedicine application and verified that I am speaking with the correct person using two identifiers.   I discussed the limitations of evaluation and management by telemedicine. The patient expressed understanding and agreed to proceed.  Pt is at home.  Provider is at office.    Pt is 47yoM who has had some elevations in creatinine.  He has been trying better to stay hydrated.    He has no complaints today.      Relevant past medical, surgical, family and social history reviewed and updated as indicated. Interim medical history since our last visit reviewed. Allergies and medications reviewed and updated.   CURRENT MEDS: none  Review of Systems  Per HPI unless specifically indicated above     Objective:    There were no vitals taken for this visit.  Wt Readings from Last 3 Encounters:  11/29/22 197 lb (89.4 kg)  11/03/22 200 lb 1.6 oz (90.8 kg)  09/21/22 198 lb 12.8 oz (90.2 kg)    Physical Exam Pulmonary:     Effort: Pulmonary effort is normal. No respiratory distress.     Comments: Pt is talking in complete sentences without dyspnea Neurological:     Mental Status: He is alert and oriented to person, place, and time.  Psychiatric:        Attention and Perception: Attention normal.        Speech: Speech normal.        Behavior: Behavior is cooperative.     Results for orders placed or performed during the hospital encounter of 07/62/26  Basic metabolic panel  Result Value Ref Range   Sodium 133 (L)  135 - 145 mmol/L   Potassium 3.4 (L) 3.5 - 5.1 mmol/L   Chloride 101 98 - 111 mmol/L   CO2 22 22 - 32 mmol/L   Glucose, Bld 94 70 - 99 mg/dL   BUN 14 8 - 23 mg/dL   Creatinine, Ser 1.23 0.61 - 1.24 mg/dL   Calcium 9.1 8.9 - 10.3 mg/dL   GFR, Estimated >60 >60 mL/min   Anion gap 10 5 - 15      Assessment & Plan:    Encounter Diagnosis  Name Primary?   Tobacco use disorder Yes      -Reviewed labs with pt- renal function improved/good -Medicare starts 01/20/23.  He is reminded to get established with new PCP -Encouraged smoking cessation

## 2023-06-22 IMAGING — US US ABDOMEN COMPLETE W/ ELASTOGRAPHY
1 series · 12 of 25 positions shown · non-contrast
Comparison: 05/21/2015

CLINICAL DATA: Chronic inactive hepatitis B, history fibrosis,
restaging

EXAM:
ULTRASOUND ABDOMEN
ULTRASOUND HEPATIC ELASTOGRAPHY
TECHNIQUE: Sonography of the upper abdomen was performed. In addition,
ultrasound elastography evaluation of the liver was performed. A
region of interest was placed within the right lobe of the liver.
Following application of a compressive sonographic pulse, tissue
compressibility was assessed. Multiple assessments were performed at
the selected site. Median tissue compressibility was determined.
Previously, hepatic stiffness was assessed by shear wave velocity.
Based on recently published Society of Radiologists in Ultrasound
consensus article, reporting is now recommended to be performed in
the SI units of pressure (kiloPascals) representing hepatic
stiffness/elasticity. The obtained result is compared to the
published reference standards. (cACLD = compensated Advanced Chronic
Liver Disease)

[Series 1: us abdomen complete w/elastography · 12 of 118 slices shown]
[im 5/118]
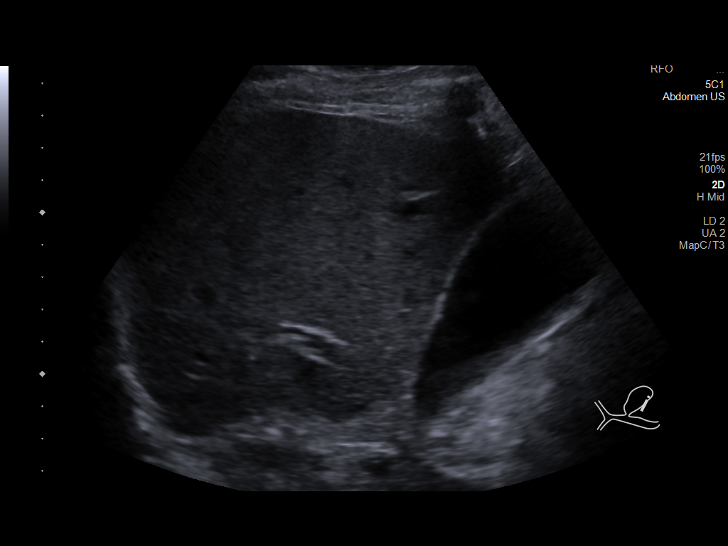
[im 15/118]
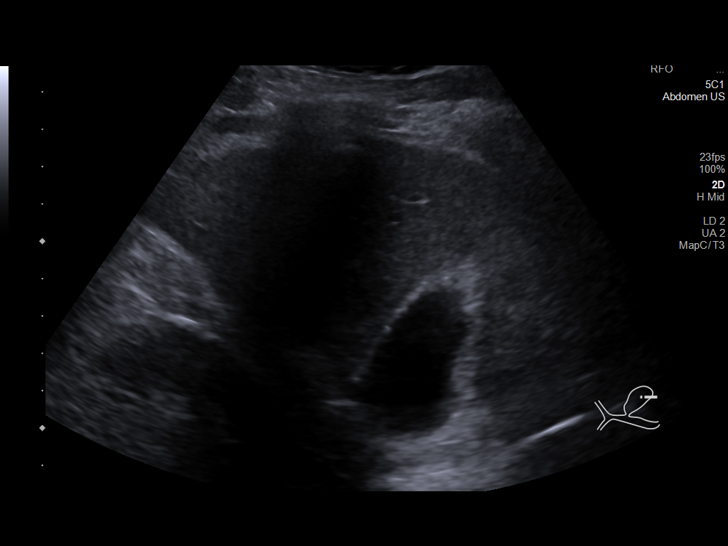
[im 25/118]
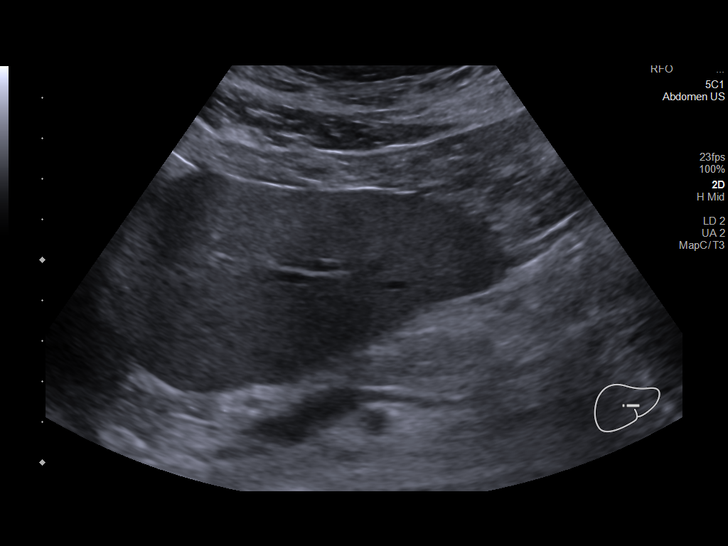
[im 35/118]
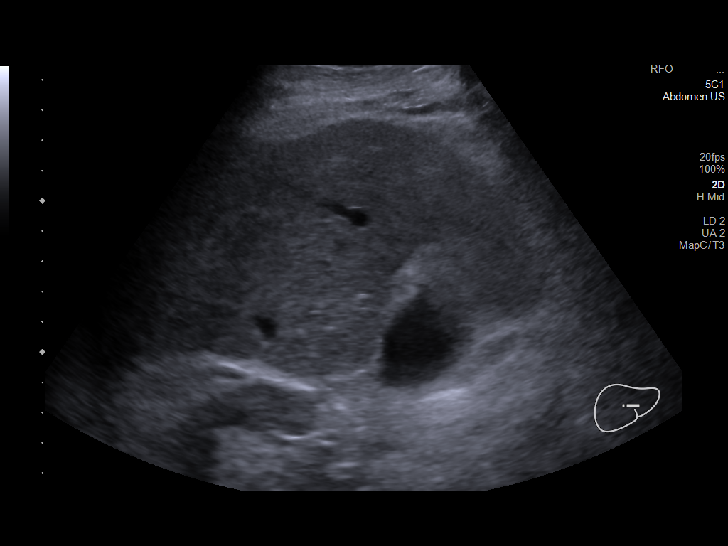
[im 44/118]
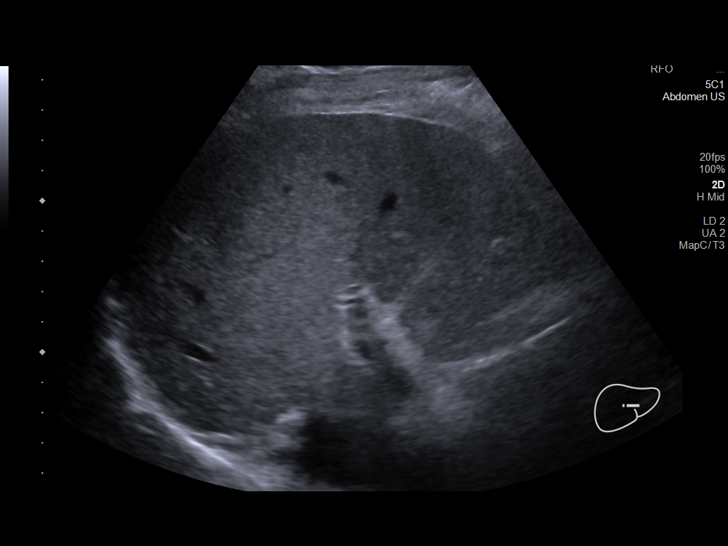
[im 54/118]
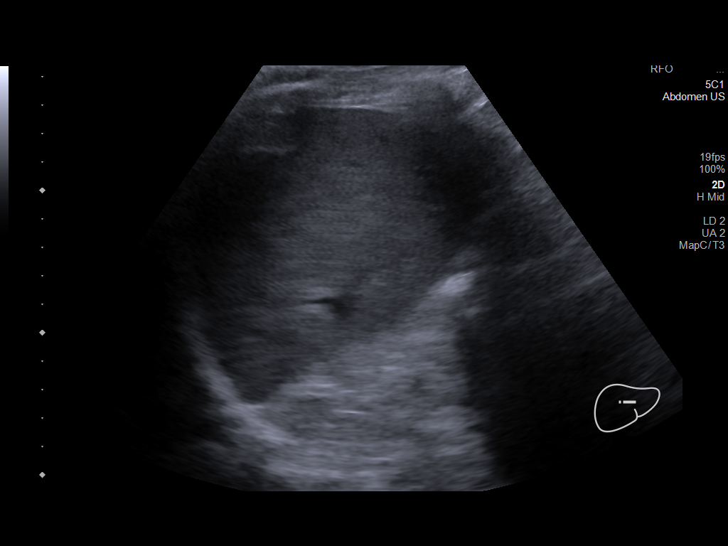
[im 64/118]
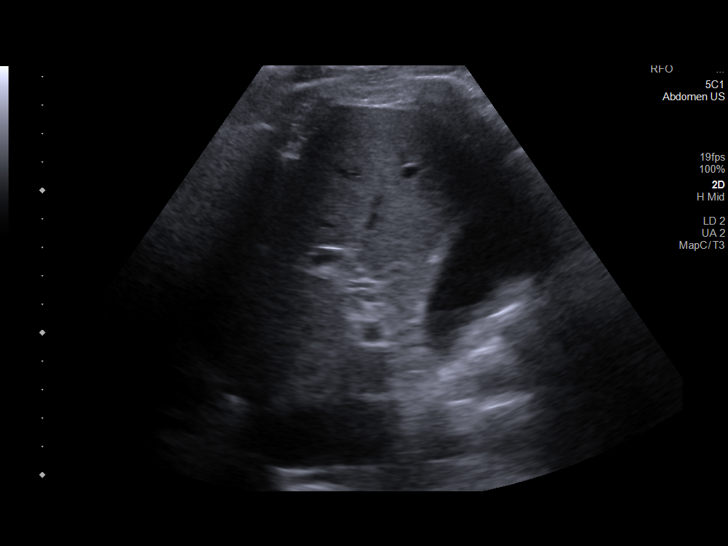
[im 74/118]
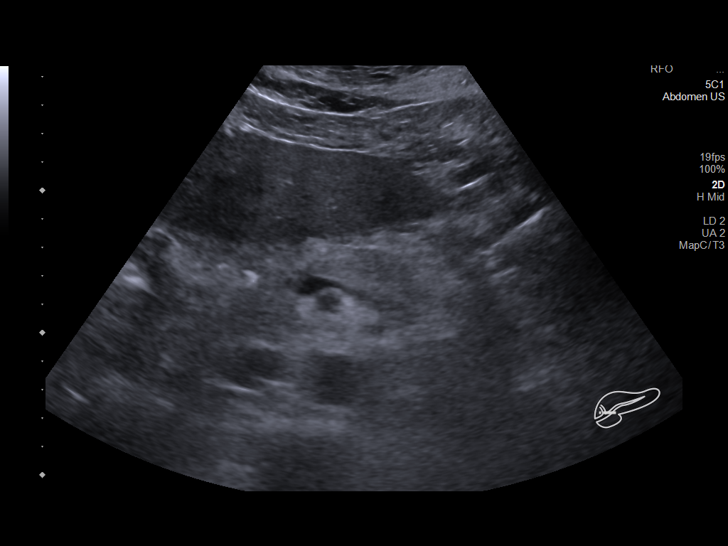
[im 83/118]
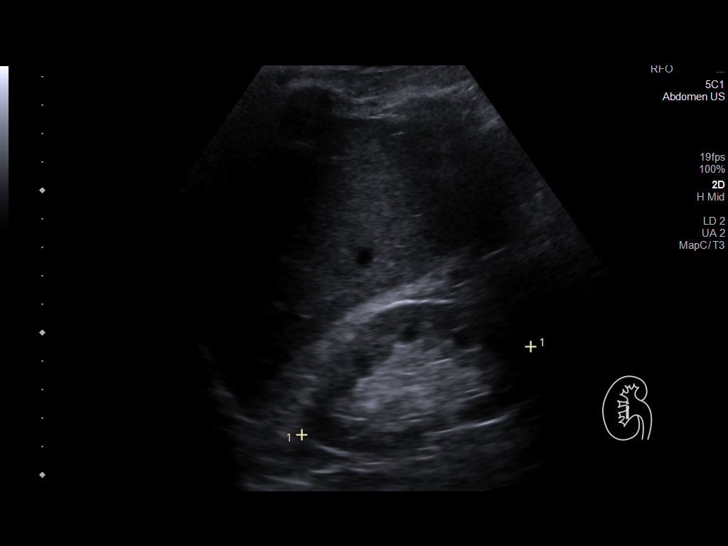
[im 93/118]
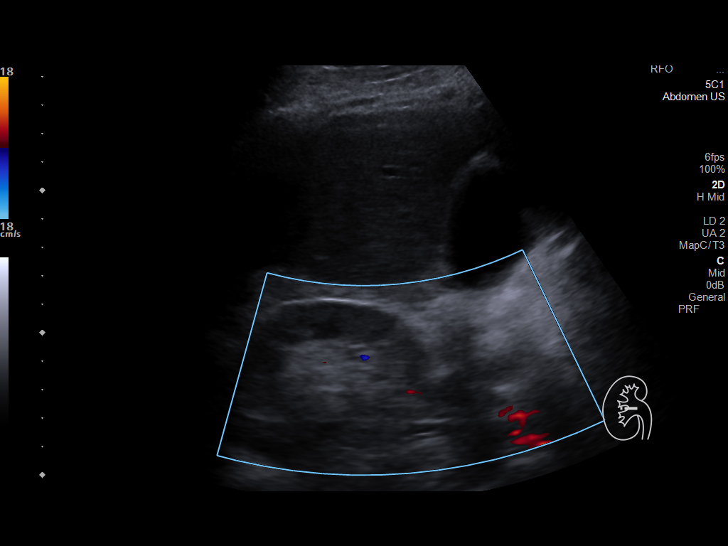
[im 103/118]
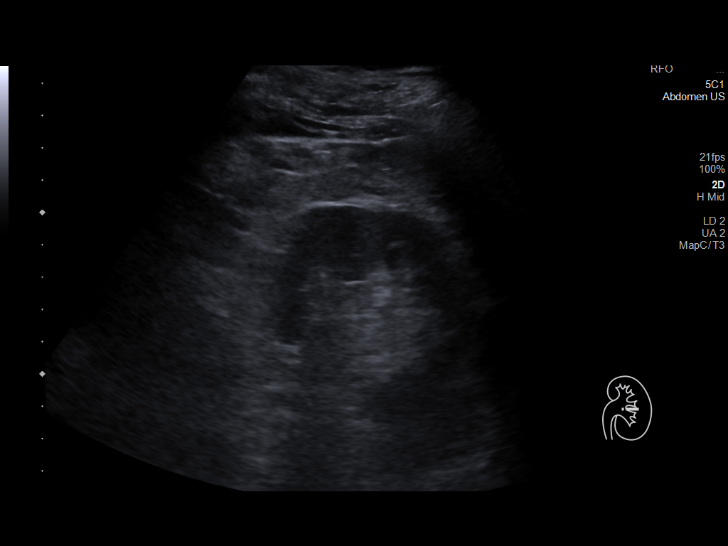
[im 113/118]
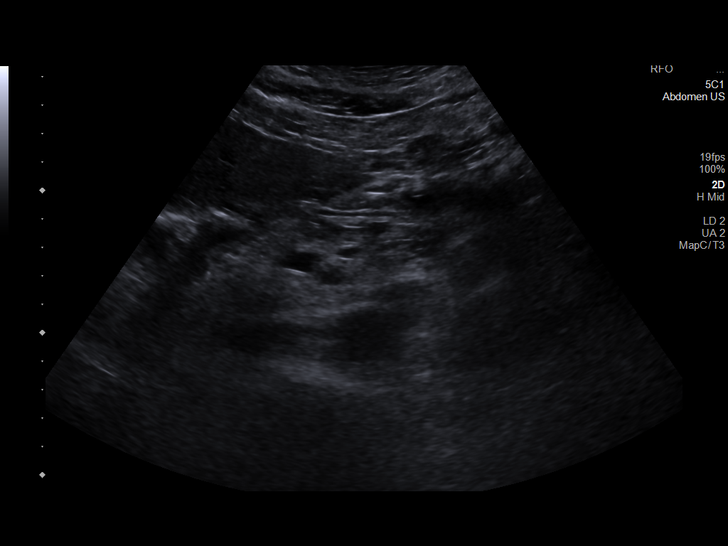

[12 of 25 positions shown; findings below may reference images not displayed]

FINDINGS: ULTRASOUND ABDOMEN

Gallbladder: Normally distended without stones or wall thickening.
No pericholecystic fluid or sonographic Murphy sign.

Common bile duct: Diameter: 4 mm, normal

Liver: Minimally nodular margin LEFT lobe liver. Mildly
heterogeneous increased hepatic echogenicity. No discrete hepatic
mass identified. Portal vein is patent on color Doppler imaging with
normal direction of blood flow towards the liver.

IVC: Normal appearance

Pancreas: Normal appearance

Spleen: Normal appearance, 6.3 cm length

Right Kidney: Length: 8.6 cm. Normal morphology without mass or
hydronephrosis.

Left Kidney: Length: 10.4 cm. Small cyst at inferior pole 16 x 18 x
14 mm. No additional mass or hydronephrosis.

Abdominal aorta: Normal caliber

Other findings: No free fluid

ULTRASOUND HEPATIC ELASTOGRAPHY

Device: Siemens Helix VTQ

Patient position: Oblique

Transducer 4V1

Number of measurements: 11

Hepatic segment:  8

Median kPa:

IQR:

IQR/Median kPa ratio:

Data quality: IQR/Median kPa ratio of 0.3 or greater indicates
reduced accuracy

Diagnostic category:  < or = 5 kPa: high probability of being normal

The use of hepatic elastography is applicable to patients with viral
hepatitis and non-alcoholic fatty liver disease. At this time, there
is insufficient data for the referenced cut-off values and use in
other causes of liver disease, including alcoholic liver disease.
Patients, however, may be assessed by elastography and serve as
their own reference standard/baseline.

In patients with non-alcoholic liver disease, the values suggesting
compensated advanced chronic liver disease (cACLD) may be lower, and
patients may need additional testing with elasticity results of [DATE]
kPa.

Please note that abnormal hepatic elasticity and shear wave
velocities may also be identified in clinical settings other than
with hepatic fibrosis, such as: acute hepatitis, elevated right
heart and central venous pressures including use of beta blockers,
Mueller disease (T[place]a Condori), infiltrative processes such as
mastocytosis/amyloidosis/infiltrative tumor/lymphoma, extrahepatic
cholestasis, with hyperemia in the post-prandial state, and with
liver transplantation. Correlation with patient history, laboratory
data, and clinical condition recommended.

Diagnostic Categories:

< or =5 kPa: high probability of being normal

< or =9 kPa: in the absence of other known clinical signs, rules [DATE] kPa and ?13 kPa: suggestive of cACLD, but needs further testing

>13 kPa: highly suggestive of cACLD

> or =17 kPa: highly suggestive of cACLD with an increased
probability of clinically significant portal hypertension
IMPRESSION: ULTRASOUND ABDOMEN:

Heterogeneous hepatic echogenicity with questionably slightly
nodular contours of the LEFT lobe of the liver.

Small LEFT renal cyst.

Remaining ultrasound abdomen unremarkable.

ULTRASOUND HEPATIC ELASTOGRAPHY:

Median kPa:

Diagnostic category:  < or = 5 kPa: high probability of being normal

## 2023-08-04 ENCOUNTER — Ambulatory Visit: Payer: No Typology Code available for payment source | Admitting: Internal Medicine

## 2023-08-04 ENCOUNTER — Encounter: Payer: Self-pay | Admitting: Internal Medicine

## 2023-08-04 VITALS — BP 144/76 | HR 70 | Ht 71.0 in | Wt 204.2 lb

## 2023-08-04 DIAGNOSIS — E538 Deficiency of other specified B group vitamins: Secondary | ICD-10-CM

## 2023-08-04 DIAGNOSIS — Z1329 Encounter for screening for other suspected endocrine disorder: Secondary | ICD-10-CM | POA: Diagnosis not present

## 2023-08-04 DIAGNOSIS — Z122 Encounter for screening for malignant neoplasm of respiratory organs: Secondary | ICD-10-CM

## 2023-08-04 DIAGNOSIS — Z131 Encounter for screening for diabetes mellitus: Secondary | ICD-10-CM | POA: Diagnosis not present

## 2023-08-04 DIAGNOSIS — Z720 Tobacco use: Secondary | ICD-10-CM | POA: Insufficient documentation

## 2023-08-04 DIAGNOSIS — Z1321 Encounter for screening for nutritional disorder: Secondary | ICD-10-CM

## 2023-08-04 DIAGNOSIS — B181 Chronic viral hepatitis B without delta-agent: Secondary | ICD-10-CM | POA: Diagnosis not present

## 2023-08-04 DIAGNOSIS — Z13220 Encounter for screening for lipoid disorders: Secondary | ICD-10-CM

## 2023-08-04 DIAGNOSIS — F17210 Nicotine dependence, cigarettes, uncomplicated: Secondary | ICD-10-CM

## 2023-08-04 DIAGNOSIS — Z23 Encounter for immunization: Secondary | ICD-10-CM

## 2023-08-04 NOTE — Assessment & Plan Note (Signed)
History of hepatitis B.  Originally diagnosed 2010.  He is followed by gastroenterology, last seen in October 2023. -Repeat labs ordered today -Needs GI follow-up this fall

## 2023-08-04 NOTE — Patient Instructions (Signed)
It was a pleasure to see you today.  Thank you for giving [place] the opportunity to be involved in your care.  Below is a brief recap of your visit and next steps.  We will plan to see you again in 6 months.  Summary You have established care today We will check basic labs You have been referred for lung cancer screening Pneumonia vaccine administered today Follow up in 6 months

## 2023-08-04 NOTE — Assessment & Plan Note (Addendum)
He currently smokes 2 packs/day and is precontemplative with regards to cessation -The patient was counseled on the dangers of tobacco use, and was advised to quit and reluctant to quit.  Reviewed strategies to maximize success, including removing cigarettes and smoking materials from environment, stress management, substitution of other forms of reinforcement, support of family/friends, and written materials.  -Patient is agreeable to lung cancer screening.  Referral for lung cancer screening program has been placed today.

## 2023-08-04 NOTE — Assessment & Plan Note (Signed)
PCV 20 administered today 

## 2023-08-04 NOTE — Assessment & Plan Note (Signed)
Previously documented history of vitamin B12 deficiency.  He is current on daily vitamin B12 supplementation.  Repeat B12 level ordered today.

## 2023-08-04 NOTE — Progress Notes (Signed)
New Patient Office Visit  Subjective    Patient ID: Kevin Davis, male    DOB: 10-02-1956  Age: 67 y.o. MRN: [id number]  CC:  Chief Complaint  Patient presents with   Establish Care    HPI Kevin Davis presents to establish care.  He is a 67 year old male who endorses a past medical history significant for hepatitis B and vitamin B12 deficiency.  Previously followed at the free clinic of [facility].  Kevin Davis reports feeling well today.  He is asymptomatic currently and has no acute concerns to discuss aside from desiring to establish care.  He is currently unemployed.  He smokes 2 packs/day of cigarettes and has been smoking since age 5.  He endorses nightly alcohol consumption, drinking 5 drinks per day.  He denies illicit drug use.  His feeling medical history significant for leukemia and breast cancer.  Chronic medical conditions and outstanding preventative care items discussed today are individually addressed in A/P below.   Outpatient Encounter Medications as of 08/04/2023  Medication Sig   cyanocobalamin (VITAMIN B12) 1000 MCG tablet Take 1,000 mcg by mouth daily.   [DISCONTINUED] Na Sulfate-K Sulfate-Mg Sulf 17.5-3.13-1.6 GM/177ML SOLN As directed (Patient not taking: Reported on 11/03/2022)   No facility-administered encounter medications on file as of 08/04/2023.    Past Medical History:  Diagnosis Date   Basal cell carcinoma 2016   Basal cell carcinoma and dysplastic nevi removed same day, 1- lower abd , 1- upper L arm   Dysplastic nevus    Hepatitis B     Past Surgical History:  Procedure Laterality Date   COLONOSCOPY N/A 05/26/2015   surgeon:  Corbin Ade, MD;  [facility];  Service: Endoscopy;  Laterality: N/A;  945   COLONOSCOPY WITH PROPOFOL N/A 11/29/2022   Procedure: COLONOSCOPY WITH PROPOFOL;  Surgeon: Corbin Ade, MD;  Location: [facility];  Service: Endoscopy;  Laterality: N/A;  8:00 am   POLYPECTOMY  11/29/2022    Procedure: POLYPECTOMY;  Surgeon: Corbin Ade, MD;  [facility]: [facility];  Service: Endoscopy;;   SKIN BIOPSY      Family History  Problem Relation Age of Onset   Cancer Mother        breast cancer   Varicose Veins Mother    Alcohol abuse Father    Depression Father    Early death Father    Acute myelogenous leukemia Sister    Hepatitis B Sister    Depression Sister    Early death Sister    Stroke Maternal Grandfather    Alcohol abuse Maternal Grandfather    Pancreatic cancer Paternal Grandfather        died age 82   Alcohol abuse Paternal Grandfather    Lymphoma Cousin    Cancer Maternal Grandmother    COPD Maternal Grandmother    Varicose Veins Maternal Grandmother    Diabetes Maternal Uncle    Diabetes Maternal Aunt    Colon cancer Neg Hx     Social History   Socioeconomic History   Marital status: Single    Spouse name: Not on file   Number of children: Not on file   Years of education: Not on file   Highest education level: Not on file  Occupational History   Occupation: unemployed  Tobacco Use   Smoking status: Every Day    Current packs/day: 1.50    Average packs/day: 1.7 packs/day for 48.8 years (82.0 ttl pk-yrs)  Types: Cigarettes   Smokeless tobacco: Never  Vaping Use   Vaping status: Never Used  Substance and Sexual Activity   Alcohol use: Yes    Alcohol/week: 10.0 standard drinks of alcohol    Types: 10 Standard drinks or equivalent per week    Comment: pint and a half a week   Drug use: No   Sexual activity: Yes    Partners: Female    Birth control/protection: Coitus interruptus  Other Topics Concern   Not on file  Social History Narrative   Not on file   Social Determinants of Health   Financial Resource Strain: Not on file  Food Insecurity: Not on file  Transportation Needs: Not on file  Physical Activity: Not on file  Stress: Not on file  Social Connections: Not on file  Intimate Partner Violence: Not on file    Review  of Systems  Constitutional:  Negative for chills and fever.  HENT:  Negative for sore throat.   Respiratory:  Negative for cough and shortness of breath.   Cardiovascular:  Negative for chest pain, palpitations and leg swelling.  Gastrointestinal:  Negative for abdominal pain, blood in stool, constipation, diarrhea, nausea and vomiting.  Genitourinary:  Negative for dysuria and hematuria.  Musculoskeletal:  Negative for myalgias.  Skin:  Negative for itching and rash.  Neurological:  Negative for dizziness and headaches.  Psychiatric/Behavioral:  Negative for depression and suicidal ideas.         Objective    BP (!) 144/76   Pulse 70   Ht 5\' 11"  (1.803 m)   Wt 204 lb 3.2 oz (92.6 kg)   SpO2 94%   BMI 28.48 kg/m   Physical Exam Vitals reviewed.  Constitutional:      General: He is not in acute distress.    Appearance: Normal appearance. He is obese. He is not ill-appearing.  HENT:     Head: Normocephalic and atraumatic.     Right Ear: External ear normal.     Left Ear: External ear normal.     Nose: Nose normal. No congestion or rhinorrhea.     Mouth/Throat:     Mouth: Mucous membranes are moist.     Pharynx: Oropharynx is clear.  Eyes:     General: No scleral icterus.    Extraocular Movements: Extraocular movements intact.     Conjunctiva/sclera: Conjunctivae normal.     Pupils: Pupils are equal, round, and reactive to light.  Cardiovascular:     Rate and Rhythm: Normal rate and regular rhythm.     Pulses: Normal pulses.     Heart sounds: Normal heart sounds. No murmur heard. Pulmonary:     Effort: Pulmonary effort is normal.     Breath sounds: Normal breath sounds. No wheezing, rhonchi or rales.  Abdominal:     General: Abdomen is flat. Bowel sounds are normal. There is no distension.     Palpations: Abdomen is soft.     Tenderness: There is no abdominal tenderness.  Musculoskeletal:        General: No swelling or deformity. Normal range of motion.      Cervical back: Normal range of motion.  Skin:    General: Skin is warm and dry.     Capillary Refill: Capillary refill takes less than 2 seconds.  Neurological:     General: No focal deficit present.     Mental Status: He is alert and oriented to person, place, and time.     Motor: No  weakness.  Psychiatric:        Mood and Affect: Mood normal.        Behavior: Behavior normal.        Thought Content: Thought content normal.   Last CBC Lab Results  Component Value Date   WBC 6.9 09/23/2022   HGB 16.7 09/23/2022   HCT 48.0 09/23/2022   MCV 100.0 09/23/2022   MCH 34.8 (H) 09/23/2022   RDW 13.2 09/23/2022   PLT 198 09/23/2022   Last metabolic panel Lab Results  Component Value Date   GLUCOSE 94 12/27/2022   NA 133 (L) 12/27/2022   K 3.4 (L) 12/27/2022   CL 101 12/27/2022   CO2 22 12/27/2022   BUN 14 12/27/2022   CREATININE 1.23 12/27/2022   GFRNONAA >60 12/27/2022   CALCIUM 9.1 12/27/2022   PROT 6.8 09/23/2022   ALBUMIN 3.9 09/23/2022   BILITOT 1.0 09/23/2022   ALKPHOS 59 09/23/2022   AST 16 09/23/2022   ALT 18 09/23/2022   ANIONGAP 10 12/27/2022   Last lipids Lab Results  Component Value Date   CHOL 145 09/21/2019   HDL 43 09/21/2019   LDLCALC 77 09/21/2019   TRIG 123 09/21/2019   CHOLHDL 3.4 09/21/2019   Last thyroid functions Lab Results  Component Value Date   TSH 1.247 08/28/2009   Last vitamin B12 and Folate Lab Results  Component Value Date   VITAMINB12 819 09/23/2022   FOLATE >40.0 09/23/2022   Assessment & Plan:   Problem List Items Addressed This Visit       Hepatitis B virus infection    History of hepatitis B.  Originally diagnosed 2010.  He is followed by gastroenterology, last seen in October 2023. -Repeat labs ordered today -Needs GI follow-up this fall      B12 deficiency    Previously documented history of vitamin B12 deficiency.  He is current on daily vitamin B12 supplementation.  Repeat B12 level ordered today.       Current tobacco use    He currently smokes 2 packs/day and is precontemplative with regards to cessation -The patient was counseled on the dangers of tobacco use, and was advised to quit and reluctant to quit.  Reviewed strategies to maximize success, including removing cigarettes and smoking materials from environment, stress management, substitution of other forms of reinforcement, support of family/friends, and written materials.  -Patient is agreeable to lung cancer screening.  Referral for lung cancer screening program has been placed today.      Need for pneumococcal 20-valent conjugate vaccination    PCV 20 administered today      Return in about 6 months (around 02/04/2024).   Billie Lade, MD

## 2023-08-05 ENCOUNTER — Encounter: Payer: Self-pay | Admitting: Internal Medicine

## 2023-08-05 ENCOUNTER — Other Ambulatory Visit: Payer: Self-pay | Admitting: Internal Medicine

## 2023-08-05 DIAGNOSIS — E538 Deficiency of other specified B group vitamins: Secondary | ICD-10-CM

## 2023-08-05 DIAGNOSIS — E559 Vitamin D deficiency, unspecified: Secondary | ICD-10-CM

## 2023-08-05 LAB — HEMOGLOBIN A1C
Est. average glucose Bld gHb Est-mCnc: 117 mg/dL
Hgb A1c MFr Bld: 5.7 % — ABNORMAL HIGH (ref 4.8–5.6)

## 2023-08-05 LAB — B12 AND FOLATE PANEL
Folate: 2.7 ng/mL — ABNORMAL LOW (ref >3.0)
Vitamin B-12: 1038 pg/mL (ref 232–1245)

## 2023-08-05 LAB — CBC WITH DIFFERENTIAL/PLATELET
Basophils Absolute: 0.1 10*3/uL (ref 0.0–0.2)
Basos: 1 %
EOS (ABSOLUTE): 0.2 10*3/uL (ref 0.0–0.4)
Eos: 3 %
Hematocrit: 48.3 % (ref 37.5–51.0)
Hemoglobin: 16.8 g/dL (ref 13.0–17.7)
Immature Grans (Abs): 0 10*3/uL (ref 0.0–0.1)
Immature Granulocytes: 0 %
Lymphocytes Absolute: 2.3 10*3/uL (ref 0.7–3.1)
Lymphs: 32 %
MCH: 34.8 pg — ABNORMAL HIGH (ref 26.6–33.0)
MCHC: 34.8 g/dL (ref 31.5–35.7)
MCV: 100 fL — ABNORMAL HIGH (ref 79–97)
Monocytes Absolute: 1 10*3/uL — ABNORMAL HIGH (ref 0.1–0.9)
Monocytes: 14 %
Neutrophils Absolute: 3.7 10*3/uL (ref 1.4–7.0)
Neutrophils: 50 %
Platelets: 203 10*3/uL (ref 150–450)
RBC: 4.83 x10E6/uL (ref 4.14–5.80)
RDW: 13.1 % (ref 11.6–15.4)
WBC: 7.2 10*3/uL (ref 3.4–10.8)

## 2023-08-05 LAB — CMP14+EGFR
ALT: 17 IU/L (ref 0–44)
AST: 17 IU/L (ref 0–40)
Albumin: 4.2 g/dL (ref 3.9–4.9)
Alkaline Phosphatase: 87 IU/L (ref 44–121)
BUN/Creatinine Ratio: 9 — ABNORMAL LOW (ref 10–24)
BUN: 13 mg/dL (ref 8–27)
Bilirubin Total: 0.9 mg/dL (ref 0.0–1.2)
CO2: 21 mmol/L (ref 20–29)
Calcium: 9.7 mg/dL (ref 8.6–10.2)
Chloride: 105 mmol/L (ref 96–106)
Creatinine, Ser: 1.38 mg/dL — ABNORMAL HIGH (ref 0.76–1.27)
Globulin, Total: 2.3 g/dL (ref 1.5–4.5)
Glucose: 101 mg/dL — ABNORMAL HIGH (ref 70–99)
Potassium: 4 mmol/L (ref 3.5–5.2)
Sodium: 139 mmol/L (ref 134–144)
Total Protein: 6.5 g/dL (ref 6.0–8.5)
eGFR: 57 mL/min/{1.73_m2} — ABNORMAL LOW (ref >59)

## 2023-08-05 LAB — LIPID PANEL
Chol/HDL Ratio: 3.6 ratio (ref 0.0–5.0)
Cholesterol, Total: 143 mg/dL (ref 100–199)
HDL: 40 mg/dL (ref >39)
LDL Chol Calc (NIH): 78 mg/dL (ref 0–99)
Triglycerides: 139 mg/dL (ref 0–149)
VLDL Cholesterol Cal: 25 mg/dL (ref 5–40)

## 2023-08-05 LAB — TSH+FREE T4
Free T4: 1.58 ng/dL (ref 0.82–1.77)
TSH: 1 u[IU]/mL (ref 0.450–4.500)

## 2023-08-05 LAB — VITAMIN D 25 HYDROXY (VIT D DEFICIENCY, FRACTURES): Vit D, 25-Hydroxy: 10.8 ng/mL — ABNORMAL LOW (ref 30.0–100.0)

## 2023-08-05 MED ORDER — FOLIC ACID 1 MG PO TABS: 1.0000 mg | ORAL_TABLET | Freq: Every day | ORAL | 1 refills | Status: DC

## 2023-08-05 MED ORDER — VITAMIN D (ERGOCALCIFEROL) 1.25 MG (50000 UNIT) PO CAPS: 50000.0000 [IU] | ORAL_CAPSULE | ORAL | 0 refills | Status: AC

## 2023-09-01 ENCOUNTER — Encounter: Payer: Self-pay | Admitting: Internal Medicine

## 2023-09-01 ENCOUNTER — Other Ambulatory Visit: Payer: Self-pay

## 2023-09-01 ENCOUNTER — Telehealth: Payer: Self-pay | Admitting: Internal Medicine

## 2023-09-01 DIAGNOSIS — Z122 Encounter for screening for malignant neoplasm of respiratory organs: Secondary | ICD-10-CM

## 2023-09-01 DIAGNOSIS — Z87891 Personal history of nicotine dependence: Secondary | ICD-10-CM

## 2023-09-01 DIAGNOSIS — F17210 Nicotine dependence, cigarettes, uncomplicated: Secondary | ICD-10-CM

## 2023-09-01 NOTE — Telephone Encounter (Signed)
Patient calling has not heard anything about schedule his ultrasound for lung screening. Call back  #[phone]

## 2023-09-01 NOTE — Telephone Encounter (Signed)
Lmtrc

## 2023-09-01 NOTE — Telephone Encounter (Signed)
Spoke with patient about referral and give patient number to call.

## 2023-09-20 ENCOUNTER — Encounter: Payer: Self-pay | Admitting: Internal Medicine

## 2023-09-20 ENCOUNTER — Telehealth: Payer: Self-pay | Admitting: *Deleted

## 2023-09-20 ENCOUNTER — Other Ambulatory Visit: Payer: Self-pay | Admitting: *Deleted

## 2023-09-20 ENCOUNTER — Ambulatory Visit (INDEPENDENT_AMBULATORY_CARE_PROVIDER_SITE_OTHER): Payer: No Typology Code available for payment source | Admitting: Internal Medicine

## 2023-09-20 ENCOUNTER — Encounter: Payer: Self-pay | Admitting: *Deleted

## 2023-09-20 VITALS — BP 137/79 | HR 57 | Temp 97.6°F | Ht 72.0 in | Wt 210.2 lb

## 2023-09-20 DIAGNOSIS — B181 Chronic viral hepatitis B without delta-agent: Secondary | ICD-10-CM | POA: Diagnosis not present

## 2023-09-20 DIAGNOSIS — R7989 Other specified abnormal findings of blood chemistry: Secondary | ICD-10-CM

## 2023-09-20 NOTE — Progress Notes (Signed)
Primary Care Physician:  Billie Lade, MD Primary Gastroenterologist:  Dr. Jena Gauss  Pre-Procedure History & Physical: HPI:  Kevin Davis is a 67 y.o. 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  History of multiple colonic adenomas removed last year; due for surveillance 2026.  Needs hepatoma screening given more advanced fibrosis.  He continues to drink regularly trying to cut back would like to stop entirely as asked me to facilitate helping him with that endeavor.  Past Medical History:  Diagnosis Date   Basal cell carcinoma 2016   Basal cell carcinoma and dysplastic nevi removed same day, 1- lower abd , 1- upper L arm   Dysplastic nevus    Hepatitis B     Past Surgical History:  Procedure Laterality Date   COLONOSCOPY N/A 05/26/2015   surgeon:  Corbin Ade, MD;  [facility];  Service: Endoscopy;  Laterality: N/A;  945   COLONOSCOPY WITH PROPOFOL N/A 11/29/2022   Procedure: COLONOSCOPY WITH PROPOFOL;  Surgeon: Corbin Ade, MD;  [facility];  Service: Endoscopy;  Laterality: N/A;  8:00 am   POLYPECTOMY  11/29/2022   Procedure: POLYPECTOMY;  Surgeon: Corbin Ade, MD;  [facility];  Service: Endoscopy;;   SKIN BIOPSY      Prior to Admission medications   Medication Sig Start Date End Date Taking? Authorizing Provider  cyanocobalamin (VITAMIN B12) 1000 MCG tablet Take 1,000 mcg by mouth daily.   Yes [provider]  folic acid (FOLVITE) 1 MG tablet Take 1 tablet (1 mg total) by mouth daily. 08/05/23  Yes Billie Lade, MD  Vitamin D, Ergocalciferol, (DRISDOL) 1.25 MG (50000 UNIT) CAPS capsule Take 1 capsule (50,000 Units total) by mouth every 7 (seven) days for 12 doses. 08/05/23 10/22/23 Yes Billie Lade, MD    Allergies as of 09/20/2023   (No Known Allergies)    Family History  Problem Relation Age of Onset   Cancer Mother        breast cancer    Varicose Veins Mother    Alcohol abuse Father    Depression Father    Early death Father    Acute myelogenous leukemia Sister    Hepatitis B Sister    Depression Sister    Early death Sister    Stroke Maternal Grandfather    Alcohol abuse Maternal Grandfather    Pancreatic cancer Paternal Grandfather        died age 70   Alcohol abuse Paternal Grandfather    Lymphoma Cousin    Cancer Maternal Grandmother    COPD Maternal Grandmother    Varicose Veins Maternal Grandmother    Diabetes Maternal Uncle    Diabetes Maternal Aunt    Colon cancer Neg Hx     Social History   Socioeconomic History   Marital status: Single    Spouse name: Not on file   Number of children: Not on file   Years of education: Not on file   Highest education level: Not on file  Occupational History   Occupation: unemployed  Tobacco Use   Smoking status: Every Day    Current packs/day: 1.50    Average packs/day: 1.7 packs/day for 48.8 years (82.1 ttl pk-yrs)    Types: Cigarettes   Smokeless tobacco: Never  Vaping Use   Vaping status: Never Used  Substance and Sexual Activity   Alcohol use: Yes    Alcohol/week:  10.0 standard drinks of alcohol    Types: 10 Standard drinks or equivalent per week    Comment: pint and a half a week   Drug use: No   Sexual activity: Yes    Partners: Female    Birth control/protection: Coitus interruptus  Other Topics Concern   Not on file  Social History Narrative   Not on file   Social Determinants of Health   Financial Resource Strain: Not on file  Food Insecurity: Not on file  Transportation Needs: Not on file  Physical Activity: Not on file  Stress: Not on file  Social Connections: Not on file  Intimate Partner Violence: Not on file    Review of Systems: See HPI, otherwise negative ROS  Physical Exam: BP 137/79 (BP Location: Left Arm, Patient Position: Sitting, Cuff Size: Large)   Pulse (!) 57   Temp 97.6 F (36.4 C) (Oral)   Ht 6' (1.829 m)    Wt 210 lb 3.2 oz (95.3 kg)   SpO2 97%   BMI 28.51 kg/m  General:   Alert,  Well-developed, well-nourished, pleasant and cooperative in NAD Lungs:  Clear throughout to auscultation.   No wheezes, crackles, or rhonchi. No acute distress. Heart:  Regular rate and rhythm; no murmurs, clicks, rubs,  or gallops. Abdomen: Non-distended, normal bowel sounds.  Soft and nontender without appreciable mass or hepatosplenomegaly.   Impression/Plan: Pleasant 67 year old gentleman with chronic inactive hepatitis B.  Repeatedly normal LFTs.  History of multiple colonic adenomas removed last year.  Alcohol use disorder.  Discussed the need for a hepatoma screening given more advanced fibrosis on noninvasive studies.  Recommendations  Surveillance colonoscopy 2026  I strongly recommend completely abstain from alcohol.    See Dr. Durwin Nora who could help with resources to move towards  that goal  Screening hepatic ultrasound now.  Plan for a repeat elastography (fibrosis scoring) in 6 months  Office visit here in 6 months with updated labs.     Notice: This dictation was prepared with Dragon dictation along with smaller phrase technology. Any transcriptional errors that result from this process are unintentional and may not be corrected upon review.

## 2023-09-20 NOTE — Telephone Encounter (Signed)
LMOVM with pt identifying himself appt of [place] on 09/29/23, Thursday, arrive at 10 am at [facility], NPO after midnight. Will send pt a MyChart message with details of appt

## 2023-09-20 NOTE — Patient Instructions (Signed)
It was good to see you again today!  You will be due for repeat colonoscopy (history of colon polyps) in 2026.  I strongly recommend you completely abstain from alcohol.  I recommend you see Dr. Durwin Nora who could help with resources to help you with that goal  Recent liver enzymes are completely normal.  Because you do have evidence of fibrosis on prior ultrasound we will do a screening ultrasound at this time to check your liver  Plan for a repeat elastography (fibrosis scoring) in 6 months  Office visit here in 6 months with updated labs.

## 2023-09-28 ENCOUNTER — Encounter: Payer: Self-pay | Admitting: Adult Health

## 2023-09-28 ENCOUNTER — Ambulatory Visit: Payer: No Typology Code available for payment source | Admitting: Adult Health

## 2023-09-28 DIAGNOSIS — F17210 Nicotine dependence, cigarettes, uncomplicated: Secondary | ICD-10-CM | POA: Diagnosis not present

## 2023-09-28 NOTE — Patient Instructions (Signed)

## 2023-09-28 NOTE — Progress Notes (Signed)
  Virtual Visit via Telephone Note  I connected with Kevin Davis , 09/28/23 9:32 AM by a telemedicine application and verified that I am speaking with the correct person using two identifiers.  Location: Patient: home Provider: home   I discussed the limitations of evaluation and management by telemedicine and the availability of in person appointments. The patient expressed understanding and agreed to proceed.   Shared Decision Making Visit Lung Cancer Screening Program [phone])   Eligibility: 67 y.o. Pack Years Smoking History Calculation =79 years (# packs/per year x # years smoked) Recent History of coughing up blood  no Unexplained weight loss? no ( >Than 15 pounds within the last 6 months ) Prior History Lung / other cancer -- skin cancer  (Diagnosis within the last 5 years already requiring surveillance chest CT Scans). Smoking Status Current Smoker   Visit Components: Discussion included one or more decision making aids. YES Discussion included risk/benefits of screening. YES Discussion included potential follow up diagnostic testing for abnormal scans. YES Discussion included meaning and risk of over diagnosis. YES Discussion included meaning and risk of False Positives. YES Discussion included meaning of total radiation exposure. YES  Counseling Included: Importance of adherence to annual lung cancer LDCT screening. YES Impact of comorbidities on ability to participate in the program. YES Ability and willingness to under diagnostic treatment. YES  Smoking Cessation Counseling: Current Smokers:  Discussed importance of smoking cessation. yes Information about tobacco cessation classes and interventions provided to patient. yes Patient provided with "ticket" for LDCT Scan. yes Symptomatic Patient. no Diagnosis Code: Tobacco Use Z72.0 Asymptomatic Patient yes  Counseling (Intermediate counseling: > three minutes counseling) B1478   Z12.2-Screening of  respiratory organs Z87.891-Personal history of nicotine dependence   Danford Bad 09/28/23

## 2023-09-29 ENCOUNTER — Ambulatory Visit (HOSPITAL_COMMUNITY)
Admission: RE | Admit: 2023-09-29 | Discharge: 2023-09-29 | Disposition: A | Discharge status: Home / Self Care | Payer: No Typology Code available for payment source | Source: Ambulatory Visit | Attending: Internal Medicine | Admitting: Internal Medicine

## 2023-09-29 ENCOUNTER — Ambulatory Visit (HOSPITAL_COMMUNITY)
Admission: RE | Admit: 2023-09-29 | Discharge: 2023-09-29 | Disposition: A | Discharge status: Home / Self Care | Payer: No Typology Code available for payment source | Source: Ambulatory Visit | Attending: Acute Care | Admitting: Acute Care

## 2023-09-29 DIAGNOSIS — B181 Chronic viral hepatitis B without delta-agent: Secondary | ICD-10-CM | POA: Insufficient documentation

## 2023-09-29 DIAGNOSIS — J439 Emphysema, unspecified: Secondary | ICD-10-CM | POA: Diagnosis not present

## 2023-09-29 DIAGNOSIS — I700 Atherosclerosis of aorta: Secondary | ICD-10-CM | POA: Insufficient documentation

## 2023-09-29 DIAGNOSIS — Z122 Encounter for screening for malignant neoplasm of respiratory organs: Secondary | ICD-10-CM

## 2023-09-29 DIAGNOSIS — F17210 Nicotine dependence, cigarettes, uncomplicated: Secondary | ICD-10-CM | POA: Diagnosis not present

## 2023-09-29 DIAGNOSIS — B1910 Unspecified viral hepatitis B without hepatic coma: Secondary | ICD-10-CM | POA: Diagnosis not present

## 2023-09-29 DIAGNOSIS — Z87891 Personal history of nicotine dependence: Secondary | ICD-10-CM | POA: Diagnosis present

## 2023-10-24 ENCOUNTER — Other Ambulatory Visit: Payer: Self-pay

## 2023-10-24 DIAGNOSIS — Z122 Encounter for screening for malignant neoplasm of respiratory organs: Secondary | ICD-10-CM

## 2023-10-24 DIAGNOSIS — F17210 Nicotine dependence, cigarettes, uncomplicated: Secondary | ICD-10-CM

## 2023-10-24 DIAGNOSIS — Z87891 Personal history of nicotine dependence: Secondary | ICD-10-CM

## 2024-02-07 ENCOUNTER — Ambulatory Visit (INDEPENDENT_AMBULATORY_CARE_PROVIDER_SITE_OTHER): Payer: No Typology Code available for payment source | Admitting: Internal Medicine

## 2024-02-07 ENCOUNTER — Encounter: Payer: Self-pay | Admitting: Internal Medicine

## 2024-02-07 ENCOUNTER — Telehealth: Payer: Self-pay

## 2024-02-07 VITALS — BP 134/77 | HR 63 | Ht 72.0 in | Wt 216.0 lb

## 2024-02-07 DIAGNOSIS — E559 Vitamin D deficiency, unspecified: Secondary | ICD-10-CM | POA: Insufficient documentation

## 2024-02-07 DIAGNOSIS — R7303 Prediabetes: Secondary | ICD-10-CM | POA: Diagnosis not present

## 2024-02-07 DIAGNOSIS — R944 Abnormal results of kidney function studies: Secondary | ICD-10-CM | POA: Diagnosis not present

## 2024-02-07 DIAGNOSIS — E538 Deficiency of other specified B group vitamins: Secondary | ICD-10-CM | POA: Diagnosis not present

## 2024-02-07 DIAGNOSIS — Z125 Encounter for screening for malignant neoplasm of prostate: Secondary | ICD-10-CM | POA: Diagnosis not present

## 2024-02-07 NOTE — Assessment & Plan Note (Signed)
GFR 57 on labs from August 2024.  Unclear if this represents his baseline or was transiently reduced.  Repeat BMP ordered today.

## 2024-02-07 NOTE — Patient Instructions (Signed)
 It was a pleasure to see you today.  Thank you for giving [place] the opportunity to be involved in your care.  Below is a brief recap of your visit and next steps.  We will plan to see you again in 6 months.  Summary No medication changes today Repeat labs ordered Follow up in 6 months

## 2024-02-07 NOTE — Addendum Note (Signed)
Addended by: Juliane Lack D on: 02/07/2024 04:31 PM   Modules accepted: Orders

## 2024-02-07 NOTE — Progress Notes (Signed)
Established Patient Office Visit  Subjective   Patient ID: Kevin Davis, male    DOB: 17-Jun-1956  Age: 68 y.o. MRN: [id number]  Chief Complaint  Patient presents with   Care Management    Six month follow up    Kevin Davis returns to care today for routine follow-up.  He was last evaluated by me in August 2024 as a new patient presenting to establish care.  No medication changes were made at that time and a referral was placed for lung cancer screening.  7-month follow-up was arranged.  In the interim, he has been seen by gastroenterology for follow-up and undergone lung cancer screening.  There have otherwise been no acute interval events. Kevin Davis reports feeling well today.  He is asymptomatic and has no acute concerns to discuss.  He states that he is approaching 40 days of sobriety from alcohol use.  Past Medical History:  Diagnosis Date   Basal cell carcinoma 2016   Basal cell carcinoma and dysplastic nevi removed same day, 1- lower abd , 1- upper L arm   Dysplastic nevus    Hepatitis B    Past Surgical History:  Procedure Laterality Date   COLONOSCOPY N/A 05/26/2015   surgeon:  Corbin Ade, MD;  [facility]: [facility];  Service: Endoscopy;  Laterality: N/A;  945   COLONOSCOPY WITH PROPOFOL N/A 11/29/2022   Procedure: COLONOSCOPY WITH PROPOFOL;  Surgeon: Corbin Ade, MD;  Location: [facility];  Service: Endoscopy;  Laterality: N/A;  8:00 am   POLYPECTOMY  11/29/2022   Procedure: POLYPECTOMY;  Surgeon: Corbin Ade, MD;  Location: [facility];  Service: Endoscopy;;   SKIN BIOPSY     Social History   Tobacco Use   Smoking status: Every Day    Current packs/day: 1.50    Average packs/day: 1.7 packs/day for 48.8 years (82.1 ttl pk-yrs)    Types: Cigarettes   Smokeless tobacco: Never  Vaping Use   Vaping status: Never Used  Substance Use Topics   Alcohol use: Yes    Alcohol/week: 10.0 standard drinks of alcohol    Types: 10 Standard drinks or  equivalent per week    Comment: pint and a half a week   Drug use: No   Family History  Problem Relation Age of Onset   Cancer Mother        breast cancer   Varicose Veins Mother    Alcohol abuse Father    Depression Father    Early death Father    Acute myelogenous leukemia Sister    Hepatitis B Sister    Depression Sister    Early death Sister    Stroke Maternal Grandfather    Alcohol abuse Maternal Grandfather    Pancreatic cancer Paternal Grandfather        died age 24   Alcohol abuse Paternal Grandfather    Lymphoma Cousin    Cancer Maternal Grandmother    COPD Maternal Grandmother    Varicose Veins Maternal Grandmother    Diabetes Maternal Uncle    Diabetes Maternal Aunt    Colon cancer Neg Hx    No Known Allergies  Review of Systems  Constitutional:  Negative for chills and fever.  HENT:  Negative for sore throat.   Respiratory:  Negative for cough and shortness of breath.   Cardiovascular:  Negative for chest pain, palpitations and leg swelling.  Gastrointestinal:  Negative for abdominal pain, blood in stool, constipation, diarrhea, nausea and  vomiting.  Genitourinary:  Negative for dysuria and hematuria.  Musculoskeletal:  Negative for myalgias.  Skin:  Negative for itching and rash.  Neurological:  Negative for dizziness and headaches.  Psychiatric/Behavioral:  Negative for depression and suicidal ideas.      Objective:     BP 134/77 (BP Location: Right Arm, Patient Position: Sitting, Cuff Size: Large)   Pulse 63   Ht 6' (1.829 m)   Wt 216 lb (98 kg)   SpO2 95%   BMI 29.29 kg/m  BP Readings from Last 3 Encounters:  02/07/24 134/77  09/20/23 137/79  08/04/23 (!) 144/76   Physical Exam Vitals reviewed.  Constitutional:      General: He is not in acute distress.    Appearance: Normal appearance. He is obese. He is not ill-appearing.  HENT:     Head: Normocephalic and atraumatic.     Right Ear: External ear normal.     Left Ear: External ear  normal.     Nose: Nose normal. No congestion or rhinorrhea.     Mouth/Throat:     Mouth: Mucous membranes are moist.     Pharynx: Oropharynx is clear.  Eyes:     General: No scleral icterus.    Extraocular Movements: Extraocular movements intact.     Conjunctiva/sclera: Conjunctivae normal.     Pupils: Pupils are equal, round, and reactive to light.  Cardiovascular:     Rate and Rhythm: Normal rate and regular rhythm.     Pulses: Normal pulses.     Heart sounds: Normal heart sounds. No murmur heard. Pulmonary:     Effort: Pulmonary effort is normal.     Breath sounds: Normal breath sounds. No wheezing, rhonchi or rales.  Abdominal:     General: Abdomen is flat. Bowel sounds are normal. There is no distension.     Palpations: Abdomen is soft.     Tenderness: There is no abdominal tenderness.  Musculoskeletal:        General: No swelling or deformity. Normal range of motion.     Cervical back: Normal range of motion.  Skin:    General: Skin is warm and dry.     Capillary Refill: Capillary refill takes less than 2 seconds.  Neurological:     General: No focal deficit present.     Mental Status: He is alert and oriented to person, place, and time.     Motor: No weakness.  Psychiatric:        Mood and Affect: Mood normal.        Behavior: Behavior normal.        Thought Content: Thought content normal.   Last CBC Lab Results  Component Value Date   WBC 7.2 08/04/2023   HGB 16.8 08/04/2023   HCT 48.3 08/04/2023   MCV 100 (H) 08/04/2023   MCH 34.8 (H) 08/04/2023   RDW 13.1 08/04/2023   PLT 203 08/04/2023   Last metabolic panel Lab Results  Component Value Date   GLUCOSE 101 (H) 08/04/2023   NA 139 08/04/2023   K 4.0 08/04/2023   CL 105 08/04/2023   CO2 21 08/04/2023   BUN 13 08/04/2023   CREATININE 1.38 (H) 08/04/2023   EGFR 57 (L) 08/04/2023   CALCIUM 9.7 08/04/2023   PROT 6.5 08/04/2023   ALBUMIN 4.2 08/04/2023   LABGLOB 2.3 08/04/2023   BILITOT 0.9 08/04/2023    ALKPHOS 87 08/04/2023   AST 17 08/04/2023   ALT 17 08/04/2023   ANIONGAP 10 12/27/2022  Last lipids Lab Results  Component Value Date   CHOL 143 08/04/2023   HDL 40 08/04/2023   LDLCALC 78 08/04/2023   TRIG 139 08/04/2023   CHOLHDL 3.6 08/04/2023   Last hemoglobin A1c Lab Results  Component Value Date   HGBA1C 5.7 (H) 08/04/2023   Last thyroid functions Lab Results  Component Value Date   TSH 1.000 08/04/2023   Last vitamin D Lab Results  Component Value Date   VD25OH 10.8 (L) 08/04/2023   Last vitamin B12 and Folate Lab Results  Component Value Date   VITAMINB12 1,038 08/04/2023   FOLATE 2.7 (L) 08/04/2023   The 10-year ASCVD risk score (Arnett DK, et al., 2019) is: 17.9%    Assessment & Plan:   Problem List Items Addressed This Visit       Folate deficiency - Primary   Noted on labs from August 2024.  He has started daily folic acid supplementation.  Repeat folate level ordered today.      Vitamin D deficiency   Noted on labs from October 2024.  He has completed high-dose, weekly vitamin D supplementation.  Repeat vitamin D level ordered today.      Prediabetes   A1c 5.7 on labs from August 2024.  He has focused on dietary changes in an effort to lose weight and improve his A1c.      Decreased GFR   GFR 57 on labs from August 2024.  Unclear if this represents his baseline or was transiently reduced.  Repeat BMP ordered today.      Return in about 6 months (around 08/06/2024) for CPE.   Billie Lade, MD

## 2024-02-07 NOTE — Telephone Encounter (Signed)
Copied from CRM [id number]. Topic: Clinical - Request for Lab/Test Order >> Feb 07, 2024  1:58 PM Clayton Bibles wrote: Reason for CRM: Rodrigo wants to see if Dr. Durwin Nora will add PSA Panel to his blood work this morning. Please call Garvis at [phone] or message him through [facility]

## 2024-02-07 NOTE — Assessment & Plan Note (Signed)
A1c 5.7 on labs from August 2024.  He has focused on dietary changes in an effort to lose weight and improve his A1c.

## 2024-02-07 NOTE — Assessment & Plan Note (Signed)
Noted on labs from October 2024.  He has completed high-dose, weekly vitamin D supplementation.  Repeat vitamin D level ordered today.

## 2024-02-07 NOTE — Telephone Encounter (Signed)
 Lab ordered.

## 2024-02-07 NOTE — Assessment & Plan Note (Signed)
Noted on labs from August 2024.  He has started daily folic acid supplementation.  Repeat folate level ordered today.

## 2024-02-08 ENCOUNTER — Encounter: Payer: Self-pay | Admitting: Internal Medicine

## 2024-02-08 ENCOUNTER — Other Ambulatory Visit: Payer: Self-pay | Admitting: Internal Medicine

## 2024-02-08 DIAGNOSIS — E559 Vitamin D deficiency, unspecified: Secondary | ICD-10-CM

## 2024-02-08 LAB — BASIC METABOLIC PANEL
BUN/Creatinine Ratio: 11 (ref 10–24)
BUN: 15 mg/dL (ref 8–27)
CO2: 20 mmol/L (ref 20–29)
Calcium: 9.9 mg/dL (ref 8.6–10.2)
Chloride: 103 mmol/L (ref 96–106)
Creatinine, Ser: 1.35 mg/dL — ABNORMAL HIGH (ref 0.76–1.27)
Glucose: 95 mg/dL (ref 70–99)
Potassium: 4.4 mmol/L (ref 3.5–5.2)
Sodium: 142 mmol/L (ref 134–144)
eGFR: 58 mL/min/{1.73_m2} — ABNORMAL LOW (ref >59)

## 2024-02-08 LAB — B12 AND FOLATE PANEL
Folate: 15.8 ng/mL (ref >3.0)
Vitamin B-12: 1046 pg/mL (ref 232–1245)

## 2024-02-08 LAB — VITAMIN D 25 HYDROXY (VIT D DEFICIENCY, FRACTURES): Vit D, 25-Hydroxy: 14 ng/mL — ABNORMAL LOW (ref 30.0–100.0)

## 2024-02-08 MED ORDER — VITAMIN D (ERGOCALCIFEROL) 1.25 MG (50000 UNIT) PO CAPS: 50000.0000 [IU] | ORAL_CAPSULE | ORAL | 0 refills | Status: DC

## 2024-02-09 ENCOUNTER — Other Ambulatory Visit: Payer: Self-pay | Admitting: Internal Medicine

## 2024-02-09 DIAGNOSIS — N1831 Chronic kidney disease, stage 3a: Secondary | ICD-10-CM

## 2024-02-09 LAB — SPECIMEN STATUS REPORT

## 2024-02-09 LAB — PSA: Prostate Specific Ag, Serum: 2.5 ng/mL (ref 0.0–4.0)

## 2024-02-09 MED ORDER — LISINOPRIL 5 MG PO TABS: 5.0000 mg | ORAL_TABLET | Freq: Every day | ORAL | 3 refills | Status: DC

## 2024-02-23 ENCOUNTER — Other Ambulatory Visit: Payer: Self-pay | Admitting: Internal Medicine

## 2024-02-23 DIAGNOSIS — R944 Abnormal results of kidney function studies: Secondary | ICD-10-CM

## 2024-02-23 DIAGNOSIS — E559 Vitamin D deficiency, unspecified: Secondary | ICD-10-CM

## 2024-02-24 ENCOUNTER — Encounter: Payer: Self-pay | Admitting: Internal Medicine

## 2024-02-24 ENCOUNTER — Other Ambulatory Visit (HOSPITAL_COMMUNITY)
Admission: RE | Admit: 2024-02-24 | Discharge: 2024-02-24 | Disposition: A | Discharge status: Home / Self Care | Source: Ambulatory Visit | Attending: Internal Medicine | Admitting: Internal Medicine

## 2024-02-24 DIAGNOSIS — R944 Abnormal results of kidney function studies: Secondary | ICD-10-CM | POA: Insufficient documentation

## 2024-02-24 DIAGNOSIS — E559 Vitamin D deficiency, unspecified: Secondary | ICD-10-CM | POA: Insufficient documentation

## 2024-02-24 LAB — BASIC METABOLIC PANEL
Anion gap: 8 (ref 5–15)
BUN: 16 mg/dL (ref 8–23)
CO2: 23 mmol/L (ref 22–32)
Calcium: 9.5 mg/dL (ref 8.9–10.3)
Chloride: 105 mmol/L (ref 98–111)
Creatinine, Ser: 1.24 mg/dL (ref 0.61–1.24)
GFR, Estimated: >60 mL/min (ref >60)
Glucose, Bld: 104 mg/dL — ABNORMAL HIGH (ref 70–99)
Potassium: 3.9 mmol/L (ref 3.5–5.1)
Sodium: 136 mmol/L (ref 135–145)

## 2024-02-24 LAB — VITAMIN D 25 HYDROXY (VIT D DEFICIENCY, FRACTURES): Vit D, 25-Hydroxy: 40.9 ng/mL (ref 30–100)

## 2024-03-13 ENCOUNTER — Encounter: Payer: Self-pay | Admitting: Internal Medicine

## 2024-04-03 ENCOUNTER — Other Ambulatory Visit (HOSPITAL_COMMUNITY)
Admission: RE | Admit: 2024-04-03 | Discharge: 2024-04-03 | Disposition: A | Discharge status: Home / Self Care | Source: Ambulatory Visit | Attending: Internal Medicine | Admitting: Internal Medicine

## 2024-04-03 DIAGNOSIS — B181 Chronic viral hepatitis B without delta-agent: Secondary | ICD-10-CM | POA: Insufficient documentation

## 2024-04-03 LAB — PROTIME-INR
INR: 1 (ref 0.8–1.2)
Prothrombin Time: 13.3 s (ref 11.4–15.2)

## 2024-04-03 LAB — COMPREHENSIVE METABOLIC PANEL WITH GFR
ALT: 18 U/L (ref 0–44)
AST: 22 U/L (ref 15–41)
Albumin: 3.9 g/dL (ref 3.5–5.0)
Alkaline Phosphatase: 68 U/L (ref 38–126)
Anion gap: 9 (ref 5–15)
BUN: 14 mg/dL (ref 8–23)
CO2: 25 mmol/L (ref 22–32)
Calcium: 9.4 mg/dL (ref 8.9–10.3)
Chloride: 102 mmol/L (ref 98–111)
Creatinine, Ser: 1.3 mg/dL — ABNORMAL HIGH (ref 0.61–1.24)
GFR, Estimated: >60 mL/min (ref >60)
Glucose, Bld: 104 mg/dL — ABNORMAL HIGH (ref 70–99)
Potassium: 3.9 mmol/L (ref 3.5–5.1)
Sodium: 136 mmol/L (ref 135–145)
Total Bilirubin: 1 mg/dL (ref 0.0–1.2)
Total Protein: 6.9 g/dL (ref 6.5–8.1)

## 2024-04-04 ENCOUNTER — Encounter: Payer: Self-pay | Admitting: Internal Medicine

## 2024-04-04 DIAGNOSIS — R944 Abnormal results of kidney function studies: Secondary | ICD-10-CM

## 2024-04-04 DIAGNOSIS — E559 Vitamin D deficiency, unspecified: Secondary | ICD-10-CM

## 2024-04-04 DIAGNOSIS — R7303 Prediabetes: Secondary | ICD-10-CM

## 2024-04-04 DIAGNOSIS — E538 Deficiency of other specified B group vitamins: Secondary | ICD-10-CM

## 2024-04-10 ENCOUNTER — Encounter: Payer: Self-pay | Admitting: Internal Medicine

## 2024-04-10 ENCOUNTER — Ambulatory Visit (INDEPENDENT_AMBULATORY_CARE_PROVIDER_SITE_OTHER): Admitting: Internal Medicine

## 2024-04-10 VITALS — BP 135/74 | HR 61 | Temp 97.8°F | Ht 71.5 in | Wt 209.8 lb

## 2024-04-10 DIAGNOSIS — F1010 Alcohol abuse, uncomplicated: Secondary | ICD-10-CM

## 2024-04-10 DIAGNOSIS — Z860101 Personal history of adenomatous and serrated colon polyps: Secondary | ICD-10-CM | POA: Diagnosis not present

## 2024-04-10 DIAGNOSIS — B181 Chronic viral hepatitis B without delta-agent: Secondary | ICD-10-CM | POA: Diagnosis not present

## 2024-04-10 DIAGNOSIS — R7989 Other specified abnormal findings of blood chemistry: Secondary | ICD-10-CM

## 2024-04-10 NOTE — Progress Notes (Unsigned)
 Primary Care Physician:  Tobi Fortes, MD Primary Gastroenterologist:  Dr. Riley Cheadle  Pre-Procedure History & Physical: HPI:  Kevin Davis is a 68 y.o. male here for for follow-up of chronic inactive hepatitis B and alcohol use disorder.  Historically his aminotransferases have been normal.  Most recent labs do indicate normal LFTs.  Creatinine elevated at 1.30 MELD 3.0 equals 10 he has never seen a nephrologist.  States he was sober for about 60 days and then relapsed readily admits to consuming a gallon of gin in the past month.  He is due for screening ultrasound at this time.  An updated hepatitis B labs. History of folate and B12 deficiency for which she takes supplementation. Multiple colonic adenomas removed 2023; due for surveillance examination next year.  Past Medical History:  Diagnosis Date   Basal cell carcinoma 2016   Basal cell carcinoma and dysplastic nevi removed same day, 1- lower abd , 1- upper L arm   Dysplastic nevus    Hepatitis B     Past Surgical History:  Procedure Laterality Date   COLONOSCOPY N/A 05/26/2015   surgeon:  Suzette Espy, MD;  [facility]: [facility];  Service: Endoscopy;  Laterality: N/A;  945   COLONOSCOPY WITH PROPOFOL  N/A 11/29/2022   Procedure: COLONOSCOPY WITH PROPOFOL ;  Surgeon: Suzette Espy, MD;  [facility];  Service: Endoscopy;  Laterality: N/A;  8:00 am   POLYPECTOMY  11/29/2022   Procedure: POLYPECTOMY;  Surgeon: Suzette Espy, MD;  [facility];  Service: Endoscopy;;   SKIN BIOPSY      Prior to Admission medications   Medication Sig Start Date End Date Taking? Authorizing Provider  Cholecalciferol (VITAMIN D -3) 125 MCG (5000 UT) TABS Take 1 tablet by mouth daily.   Yes [provider]  cyanocobalamin  (VITAMIN B12) 1000 MCG tablet Take 1,000 mcg by mouth daily.   Yes [provider]  folic acid  (FOLVITE ) 1 MG tablet Take 1 tablet (1 mg total) by mouth daily. 08/05/23  Yes Tobi Fortes, MD    Allergies as of 04/10/2024   (No Known Allergies)    Family History  Problem Relation Age of Onset   Cancer Mother        breast cancer   Varicose Veins Mother    Alcohol abuse Father    Depression Father    Early death Father    Acute myelogenous leukemia Sister    Hepatitis B Sister    Depression Sister    Early death Sister    Stroke Maternal Grandfather    Alcohol abuse Maternal Grandfather    Pancreatic cancer Paternal Grandfather        died age 23   Alcohol abuse Paternal Grandfather    Lymphoma Cousin    Cancer Maternal Grandmother    COPD Maternal Grandmother    Varicose Veins Maternal Grandmother    Diabetes Maternal Uncle    Diabetes Maternal Aunt    Colon cancer Neg Hx     Social History   Socioeconomic History   Marital status: Single    Spouse name: Not on file   Number of children: Not on file   Years of education: Not on file   Highest education level: Bachelor's degree (e.g., BA, AB, BS)  Occupational History   Occupation: unemployed  Tobacco Use   Smoking status: Every Day    Current packs/day: 1.50    Average packs/day: 1.7 packs/day for 48.8 years (  82.1 ttl pk-yrs)    Types: Cigarettes   Smokeless tobacco: Never  Vaping Use   Vaping status: Never Used  Substance and Sexual Activity   Alcohol use: Yes    Alcohol/week: 10.0 standard drinks of alcohol    Types: 10 Standard drinks or equivalent per week    Comment: pint and a half a week   Drug use: No   Sexual activity: Yes    Partners: Female    Birth control/protection: Coitus interruptus  Other Topics Concern   Not on file  Social History Narrative   Not on file   Social Drivers of Health   Financial Resource Strain: Medium Risk (02/03/2024)   Overall Financial Resource Strain (CARDIA)    Difficulty of Paying Living Expenses: Somewhat hard  Food Insecurity: Food Insecurity Present (02/03/2024)   Hunger Vital Sign    Worried About Running Out of Food in the Last  Year: Sometimes true    Ran Out of Food in the Last Year: Sometimes true  Transportation Needs: No Transportation Needs (02/03/2024)   PRAPARE - Administrator, Civil Service (Medical): No    Lack of Transportation (Non-Medical): No  Physical Activity: Sufficiently Active (02/03/2024)   Exercise Vital Sign    Days of Exercise per Week: 5 days    Minutes of Exercise per Session: 50 min  Stress: Stress Concern Present (02/03/2024)   [employer] of Occupational Health - Occupational Stress Questionnaire    Feeling of Stress : To some extent  Social Connections: Socially Isolated (02/03/2024)   Social Connection and Isolation Panel [NHANES]    Frequency of Communication with Friends and Family: Three times a week    Frequency of Social Gatherings with Friends and Family: Twice a week    Attends Religious Services: Never    Database administrator or Organizations: No    Attends Engineer, structural: Not on file    Marital Status: Never married  Catering manager Violence: Not on file    Review of Systems: See HPI, otherwise negative ROS  Physical Exam: BP 135/74 (BP Location: Right Arm, Patient Position: Sitting, Cuff Size: Large)   Pulse 61   Temp 97.8 F (36.6 C) (Oral)   Ht 5' 11.5" (1.816 m)   Wt 209 lb 12.8 oz (95.2 kg)   SpO2 96%   BMI 28.85 kg/m  General:   Alert,  Well-developed, well-nourished, pleasant and cooperative in NAD Skin: No jaundice.  No cutaneous stigmata chronic liver disease Neck:  Supple; no masses or thyromegaly. No significant cervical adenopathy. Lungs:  Clear throughout to auscultation.   No wheezes, crackles, or rhonchi. No acute distress. Heart:  Regular rate and rhythm; no murmurs, clicks, rubs,  or gallops. Abdomen: Non-distended, normal bowel sounds.  Soft and nontender without appreciable mass or hepatosplenomegaly.   Impression/Plan: 68 year old gentleman with chronic and active hepatitis B.  Normal LFTs.  Significant  alcohol use disorder superimposed.  Surprisingly, his LFTs are completely normal.   Recommend a CK third-party help for alcohol cessation  Your recent liver enzymes were completely normal which is good.  We will update hepatitis B labs today.  We will include AFP tumor marker  Continue with screening ultrasound every 6 months  Recommend referral to nephrology due to elevated creatinine to further evaluate kidney function.  Plan for surveillance colonoscopy (history of colonic polyps) 2026.  Office visit 1 year.   Notice: This dictation was prepared with Dragon dictation along with smaller phrase technology.  Any transcriptional errors that result from this process are unintentional and may not be corrected upon review.

## 2024-04-10 NOTE — Patient Instructions (Signed)
 It was good to see you again today!  Recommend a CK third-party help for alcohol cessation  Your recent liver enzymes were completely normal which is good.  We will update hepatitis B labs today.  We will include AFP tumor marker  Continue with screening ultrasound every 6 months  Recommend referral to nephrology due to elevated creatinine to further evaluate kidney function.  Plan for surveillance colonoscopy (history of colonic polyps) 2026.  Office visit 1 year.

## 2024-04-12 ENCOUNTER — Other Ambulatory Visit (HOSPITAL_COMMUNITY)
Admission: RE | Admit: 2024-04-12 | Discharge: 2024-04-12 | Disposition: A | Discharge status: Home / Self Care | Source: Ambulatory Visit | Attending: Internal Medicine | Admitting: Internal Medicine

## 2024-04-12 DIAGNOSIS — B182 Chronic viral hepatitis C: Secondary | ICD-10-CM | POA: Diagnosis not present

## 2024-04-12 DIAGNOSIS — R7989 Other specified abnormal findings of blood chemistry: Secondary | ICD-10-CM | POA: Insufficient documentation

## 2024-04-13 LAB — AFP TUMOR MARKER: AFP, Serum, Tumor Marker: 2.9 ng/mL (ref 0.0–8.4)

## 2024-04-14 LAB — HEPATITIS B DNA, ULTRAQUANTITATIVE, PCR
HBV DNA SERPL PCR-ACNC: NOT DETECTED [IU]/mL
HBV DNA SERPL PCR-LOG IU: UNDETERMINED {Log_IU}/mL

## 2024-04-14 LAB — HEPATITIS B E ANTIBODY: Hep B E Ab: REACTIVE — AB

## 2024-04-14 LAB — HEPATITIS B E ANTIGEN: Hep B E Ag: NEGATIVE

## 2024-05-04 ENCOUNTER — Other Ambulatory Visit: Payer: Self-pay

## 2024-05-04 DIAGNOSIS — N1831 Chronic kidney disease, stage 3a: Secondary | ICD-10-CM

## 2024-05-08 DIAGNOSIS — R7303 Prediabetes: Secondary | ICD-10-CM | POA: Diagnosis not present

## 2024-05-08 DIAGNOSIS — E559 Vitamin D deficiency, unspecified: Secondary | ICD-10-CM | POA: Diagnosis not present

## 2024-05-08 DIAGNOSIS — E538 Deficiency of other specified B group vitamins: Secondary | ICD-10-CM | POA: Diagnosis not present

## 2024-05-08 DIAGNOSIS — R944 Abnormal results of kidney function studies: Secondary | ICD-10-CM | POA: Diagnosis not present

## 2024-05-09 ENCOUNTER — Ambulatory Visit: Payer: Self-pay | Admitting: Internal Medicine

## 2024-05-09 LAB — TSH+FREE T4
Free T4: 1.35 ng/dL (ref 0.82–1.77)
TSH: 1.67 u[IU]/mL (ref 0.450–4.500)

## 2024-05-09 LAB — CMP14+EGFR
ALT: 21 IU/L (ref 0–44)
AST: 20 IU/L (ref 0–40)
Albumin: 4.3 g/dL (ref 3.9–4.9)
Alkaline Phosphatase: 80 IU/L (ref 44–121)
BUN/Creatinine Ratio: 11 (ref 10–24)
BUN: 15 mg/dL (ref 8–27)
Bilirubin Total: 0.8 mg/dL (ref 0.0–1.2)
CO2: 19 mmol/L — ABNORMAL LOW (ref 20–29)
Calcium: 10 mg/dL (ref 8.6–10.2)
Chloride: 103 mmol/L (ref 96–106)
Creatinine, Ser: 1.31 mg/dL — ABNORMAL HIGH (ref 0.76–1.27)
Globulin, Total: 2.1 g/dL (ref 1.5–4.5)
Glucose: 97 mg/dL (ref 70–99)
Potassium: 4.4 mmol/L (ref 3.5–5.2)
Sodium: 139 mmol/L (ref 134–144)
Total Protein: 6.4 g/dL (ref 6.0–8.5)
eGFR: 60 mL/min/{1.73_m2} (ref >59)

## 2024-05-09 LAB — CBC WITH DIFFERENTIAL/PLATELET
Basophils Absolute: 0.1 10*3/uL (ref 0.0–0.2)
Basos: 1 %
EOS (ABSOLUTE): 0.2 10*3/uL (ref 0.0–0.4)
Eos: 3 %
Hematocrit: 50.6 % (ref 37.5–51.0)
Hemoglobin: 17.4 g/dL (ref 13.0–17.7)
Immature Grans (Abs): 0 10*3/uL (ref 0.0–0.1)
Immature Granulocytes: 0 %
Lymphocytes Absolute: 3.5 10*3/uL — ABNORMAL HIGH (ref 0.7–3.1)
Lymphs: 46 %
MCH: 34.5 pg — ABNORMAL HIGH (ref 26.6–33.0)
MCHC: 34.4 g/dL (ref 31.5–35.7)
MCV: 100 fL — ABNORMAL HIGH (ref 79–97)
Monocytes Absolute: 0.8 10*3/uL (ref 0.1–0.9)
Monocytes: 11 %
Neutrophils Absolute: 2.9 10*3/uL (ref 1.4–7.0)
Neutrophils: 39 %
Platelets: 199 10*3/uL (ref 150–450)
RBC: 5.04 x10E6/uL (ref 4.14–5.80)
RDW: 13.7 % (ref 11.6–15.4)
WBC: 7.5 10*3/uL (ref 3.4–10.8)

## 2024-05-09 LAB — B12 AND FOLATE PANEL
Folate: >20 ng/mL (ref >3.0)
Vitamin B-12: 1370 pg/mL — ABNORMAL HIGH (ref 232–1245)

## 2024-05-09 LAB — VITAMIN D 25 HYDROXY (VIT D DEFICIENCY, FRACTURES): Vit D, 25-Hydroxy: 39.5 ng/mL (ref 30.0–100.0)

## 2024-07-04 ENCOUNTER — Encounter: Payer: Self-pay | Admitting: Gastroenterology

## 2024-07-04 ENCOUNTER — Ambulatory Visit (INDEPENDENT_AMBULATORY_CARE_PROVIDER_SITE_OTHER): Admitting: Gastroenterology

## 2024-07-04 VITALS — BP 122/69 | HR 69 | Temp 98.7°F | Ht 71.0 in | Wt 207.4 lb

## 2024-07-04 DIAGNOSIS — B181 Chronic viral hepatitis B without delta-agent: Secondary | ICD-10-CM

## 2024-07-04 DIAGNOSIS — F1010 Alcohol abuse, uncomplicated: Secondary | ICD-10-CM | POA: Diagnosis not present

## 2024-07-04 NOTE — Patient Instructions (Signed)
 Whenever you are ready to schedule liver ultrasound with elastography please send us  a mychart message or call. The order has been placed and is available whenever you are ready to schedule.  Continue to try and limit, reduce alcohol use. Goal would be complete cessation. You are at risk of developing advanced fibrosis/cirrhosis.   Plan to return to see Dr. Shaaron in 12/2024 as discussed today. If you need to be seen sooner, please let us  know.

## 2024-07-04 NOTE — Progress Notes (Signed)
 GI Office Note    Referring Provider: Melvenia Manus BRAVO, MD Primary Care Physician:  Tobie Suzzane POUR, MD  Primary Gastroenterologist: Ozell Hollingshead, MD   Chief Complaint   Chief Complaint  Patient presents with   Follow-up    Follow up on abd labs. Pt doesn't know why he is here    History of Present Illness   Kevin Davis is a 68 y.o. male presenting today for follow up. Last seen in 03/2024. History of chronic inactive hepatitis B, hepatic fibrosis (F2/F3 2016, lower median kPa of 2.7 but slighly nodular appearing left liver lobe in 2022), and etoh use disorder. H/o adenomatous colon polyps, due for colonoscopy next year.   H/o folate and B12 def for which he takes supplementation.   At time of last ov, he stated he was sober for 60 days but relapsed. Admitted to consuming gallon of gin in previous month.   Labs 03/2024: AFP 2.9, HBV DNA not detected, hepatitis B E antigen negative, hepatitis B E antibody reactive  Labs May 2025: B12 1370, folate greater than 20, vitamin D39.5, TSH 1.670, free T4 1.35, glucose 97, creatinine 1.31, sodium 139, potassium 4.4, albumin 4.3, total bilirubin 0.8, alk phos 80, AST 20, ALT 21.  We requested he come in for visit today as he was having several questions and concerns over mychart. Specifically concerns for diffuse itching. Patient states he researched nutritional deficiency in chronic kidney disease and found that zinc could be a problem. His itching resolved after starting Zinc.   He sees nephrologist for first time next Friday.  Today: denies abdominal pain, itching, N/V, jaundice. Good appetite. BMs good. Little weight loss during summer common for him with increased physical activity. No heartburn.   Notes that is he drinking etoh. Drinks more in the summer. Today he reports that he may drink shot daily vs 1/5 a day if he has not drank regularly. Averages about 1 liter per month. Typically takes a 30 to 60 day break twice a year,  winter, spring. Does not feel he could quick completely. Denies DTs. Denies DUIs. Voices understanding that etoh could negative affect his liver health.       Colonoscopy 11/2022: -diverticulosis -six 2-7 mm polyp removed, multiple tubular adenomas -next colonoscopy in 3 years   Medications   Current Outpatient Medications  Medication Sig Dispense Refill   Cholecalciferol (VITAMIN D -3) 125 MCG (5000 UT) TABS Take 1 tablet by mouth daily.     cyanocobalamin  (VITAMIN B12) 1000 MCG tablet Take 1,000 mcg by mouth daily.     folic acid  (FOLVITE ) 1 MG tablet Take 1 tablet (1 mg total) by mouth daily. 90 tablet 1   zinc gluconate 50 MG tablet Take 25 mg by mouth daily.     No current facility-administered medications for this visit.    Allergies   Allergies as of 07/04/2024   (No Known Allergies)       Review of Systems   General: Negative for anorexia, weight loss, fever, chills, fatigue, weakness. ENT: Negative for hoarseness, difficulty swallowing , nasal congestion. CV: Negative for chest pain, angina, palpitations, dyspnea on exertion, peripheral edema.  Respiratory: Negative for dyspnea at rest, dyspnea on exertion, cough, sputum, wheezing.  GI: See history of present illness. GU:  Negative for dysuria, hematuria, urinary incontinence, urinary frequency, nocturnal urination.  Endo: Negative for unusual weight change.     Physical Exam   BP 122/69   Pulse 69   Temp  98.7 F (37.1 C)   Ht 5' 11 (1.803 m)   Wt 207 lb 6.4 oz (94.1 kg)   BMI 28.93 kg/m    General: Well-nourished, well-developed in no acute distress.  Eyes: No icterus. Mouth: Oropharyngeal mucosa moist and pink   Lungs: Clear to auscultation bilaterally.  Heart: Regular rate and rhythm, no murmurs rubs or gallops.  Abdomen: Bowel sounds are normal, nontender, nondistended, no hepatosplenomegaly or masses,  no abdominal bruits or hernia , no rebound or guarding.  Rectal: not performed Extremities: No  lower extremity edema. No clubbing or deformities. Neuro: Alert and oriented x 4   Skin: Warm and dry, no jaundice.   Psych: Alert and cooperative, normal mood and affect.  Labs   See hpi  Lab Results  Component Value Date   NA 139 05/08/2024   CL 103 05/08/2024   K 4.4 05/08/2024   CO2 19 (L) 05/08/2024   BUN 15 05/08/2024   CREATININE 1.31 (H) 05/08/2024   EGFR 60 05/08/2024   CALCIUM 10.0 05/08/2024   ALBUMIN 4.3 05/08/2024   GLUCOSE 97 05/08/2024   Lab Results  Component Value Date   ALT 21 05/08/2024   AST 20 05/08/2024   ALKPHOS 80 05/08/2024   BILITOT 0.8 05/08/2024   Lab Results  Component Value Date   WBC 7.5 05/08/2024   HGB 17.4 05/08/2024   HCT 50.6 05/08/2024   MCV 100 (H) 05/08/2024   PLT 199 05/08/2024   Lab Results  Component Value Date   VITAMINB12 1,370 (H) 05/08/2024   Lab Results  Component Value Date   FOLATE >20.0 05/08/2024    Imaging Studies   No results found.  Assessment/Plan:   Chronic inactive Hep B/chronic etoh use disorder: -discussed complete etoh cessation, patient not ready -he is up to date on Hep B/liver labs -we discussed updating abd u/s with elastography so that we can determine if any concern for advanced fibrosis. Order placed. He will call when he is ready to scheduled citing he has several things to take care of first -he will return in 12/2024 for follow up -can update labs at that time        Sonny RAMAN. Ezzard, MHS, PA-C [facility]

## 2024-07-13 ENCOUNTER — Other Ambulatory Visit (HOSPITAL_COMMUNITY): Payer: Self-pay | Admitting: Nephrology

## 2024-07-13 DIAGNOSIS — N1831 Chronic kidney disease, stage 3a: Secondary | ICD-10-CM

## 2024-07-13 DIAGNOSIS — E559 Vitamin D deficiency, unspecified: Secondary | ICD-10-CM | POA: Diagnosis not present

## 2024-07-13 DIAGNOSIS — R7303 Prediabetes: Secondary | ICD-10-CM | POA: Diagnosis not present

## 2024-07-13 DIAGNOSIS — B181 Chronic viral hepatitis B without delta-agent: Secondary | ICD-10-CM | POA: Diagnosis not present

## 2024-07-24 DIAGNOSIS — E211 Secondary hyperparathyroidism, not elsewhere classified: Secondary | ICD-10-CM | POA: Diagnosis not present

## 2024-07-24 DIAGNOSIS — I10 Essential (primary) hypertension: Secondary | ICD-10-CM | POA: Diagnosis not present

## 2024-07-24 DIAGNOSIS — E119 Type 2 diabetes mellitus without complications: Secondary | ICD-10-CM | POA: Diagnosis not present

## 2024-07-24 DIAGNOSIS — R7303 Prediabetes: Secondary | ICD-10-CM | POA: Diagnosis not present

## 2024-07-24 DIAGNOSIS — N189 Chronic kidney disease, unspecified: Secondary | ICD-10-CM | POA: Diagnosis not present

## 2024-07-24 DIAGNOSIS — D631 Anemia in chronic kidney disease: Secondary | ICD-10-CM | POA: Diagnosis not present

## 2024-07-24 DIAGNOSIS — R809 Proteinuria, unspecified: Secondary | ICD-10-CM | POA: Diagnosis not present

## 2024-08-02 ENCOUNTER — Ambulatory Visit (HOSPITAL_COMMUNITY)
Admission: RE | Admit: 2024-08-02 | Discharge: 2024-08-02 | Disposition: A | Discharge status: Home / Self Care | Source: Ambulatory Visit | Attending: Nephrology | Admitting: Nephrology

## 2024-08-02 DIAGNOSIS — N1831 Chronic kidney disease, stage 3a: Secondary | ICD-10-CM | POA: Insufficient documentation

## 2024-08-09 ENCOUNTER — Encounter: Payer: No Typology Code available for payment source | Admitting: Internal Medicine

## 2024-08-10 ENCOUNTER — Ambulatory Visit (INDEPENDENT_AMBULATORY_CARE_PROVIDER_SITE_OTHER)

## 2024-08-10 VITALS — BP 143/86 | HR 73 | Resp 18 | Ht 71.5 in | Wt 204.1 lb

## 2024-08-10 DIAGNOSIS — Z125 Encounter for screening for malignant neoplasm of prostate: Secondary | ICD-10-CM | POA: Diagnosis not present

## 2024-08-10 DIAGNOSIS — Z0000 Encounter for general adult medical examination without abnormal findings: Secondary | ICD-10-CM

## 2024-08-10 DIAGNOSIS — Z0001 Encounter for general adult medical examination with abnormal findings: Secondary | ICD-10-CM | POA: Diagnosis not present

## 2024-08-10 DIAGNOSIS — E559 Vitamin D deficiency, unspecified: Secondary | ICD-10-CM

## 2024-08-10 DIAGNOSIS — R7303 Prediabetes: Secondary | ICD-10-CM | POA: Diagnosis not present

## 2024-08-10 DIAGNOSIS — E538 Deficiency of other specified B group vitamins: Secondary | ICD-10-CM | POA: Diagnosis not present

## 2024-08-10 NOTE — Progress Notes (Signed)
 Complete physical exam  Patient: Kevin Davis   DOB: [birth year]   68 y.o. Male  MRN: [id number]  Subjective:    Chief Complaint  Patient presents with   Annual Exam    Cpe     Discussed the use of AI scribe software for clinical note transcription with the patient, who gave verbal consent to proceed.  History of Present Illness   IZIC STFORT is a 68 year old male with chronic kidney disease who presents for a routine physical exam.  Chronic kidney disease and associated symptoms - Chronic kidney disease, stage 3A per nephrology evaluation - Recent laboratory findings include elevated creatinine and low GFR - Awaiting results from a kidney ultrasound performed two weeks ago - Follow-up nephrology appointment scheduled for October 31, 2024 - Pruritus since late May, primarily behind the ears, on the face, and upper body - Blurred, filmy sensation over the eyes - Zinc supplementation provides symptomatic relief for pruritus and ocular symptoms - Believes symptoms may be related to kidney dysfunction affecting zinc levels  Nicotine dependence - Currently using nicotine gum in an effort to quit smoking - Difficulty discontinuing tobacco use - Previous attempt with hypnosis in the 1990s was effective for approximately 18 months - No use of varenicline due to concerns about potential side effects, including vivid dreams  Glycemic variability - Glucose levels typically normal when fasting for several hours - Elevated postprandial glucose levels - Last meal at 8 AM on the day of visit  Vitamin b12 elevation - History of elevated B12 levels, possibly related to omega-3 supplements containing B12  Fatigue and sleep disturbance - Awoke at 3 AM on the day of visit and has not napped since - Currently experiencing fatigue  Gastrointestinal symptoms - No gastrointestinal complaints      Most recent fall risk assessment:    08/10/2024    2:15 PM  Fall Risk   Falls in the  past year? 0  Number falls in past yr: 0  Injury with Fall? 0  Follow up Falls evaluation completed     Most recent depression screenings:    08/10/2024    2:15 PM 08/04/2023    1:31 PM  PHQ 2/9 Scores  PHQ - 2 Score 0 2  PHQ- 9 Score 2 5      Patient Active Problem List   Diagnosis Date Noted   ETOH abuse 07/04/2024   Vitamin D  deficiency 02/07/2024   Prediabetes 02/07/2024   Decreased GFR 02/07/2024   Current tobacco use 08/04/2023   Need for pneumococcal 20-valent conjugate vaccination 08/04/2023   B12 deficiency 09/21/2022   Folate deficiency 09/21/2022   Upper abdominal pain 09/21/2022   Urinary symptom or sign 07/20/2021   Cigarette nicotine dependence without complication 02/24/2016   Rectal polyp    History of colonic polyps    Heme positive stool 04/29/2015   Hepatitis B virus infection 08/27/2009   FATIGUE 08/27/2009   RUQ PAIN 08/27/2009      Patient Care Team: Bevely Doffing, FNP as PCP - General (Family Medicine) Shaaron, Lamar HERO, MD as Consulting Physician (Gastroenterology)   Outpatient Medications Prior to Visit  Medication Sig   Cholecalciferol (VITAMIN D -3) 125 MCG (5000 UT) TABS Take 1 tablet by mouth daily.   cyanocobalamin  (VITAMIN B12) 1000 MCG tablet Take 1,000 mcg by mouth daily.   folic acid  (FOLVITE ) 1 MG tablet Take 1 tablet (1 mg total) by mouth daily.   zinc gluconate 50 MG tablet Take  25 mg by mouth daily.   No facility-administered medications prior to visit.    ROS        Objective:     BP (!) 143/86   Pulse 73   Resp 18   Ht 5' 11.5 (1.816 m)   Wt 204 lb 1.3 oz (92.6 kg)   SpO2 93%   BMI 28.07 kg/m  BP Readings from Last 3 Encounters:  08/10/24 (!) 143/86  07/04/24 122/69  04/10/24 135/74   Wt Readings from Last 3 Encounters:  08/10/24 204 lb 1.3 oz (92.6 kg)  07/04/24 207 lb 6.4 oz (94.1 kg)  04/10/24 209 lb 12.8 oz (95.2 kg)      Physical Exam Vitals and nursing note reviewed.  Constitutional:       Appearance: Normal appearance.  HENT:     Head: Normocephalic.     Right Ear: Tympanic membrane, ear canal and external ear normal.     Left Ear: Tympanic membrane, ear canal and external ear normal.     Nose: Nose normal.     Mouth/Throat:     Mouth: Mucous membranes are moist.     Pharynx: Oropharynx is clear.  Cardiovascular:     Rate and Rhythm: Normal rate and regular rhythm.  Pulmonary:     Effort: Pulmonary effort is normal.     Breath sounds: Normal breath sounds.  Musculoskeletal:     Cervical back: Normal range of motion and neck supple.  Skin:    General: Skin is warm and dry.  Neurological:     Mental Status: He is alert and oriented to person, place, and time.  Psychiatric:        Mood and Affect: Mood normal.        Thought Content: Thought content normal.           Assessment & Plan:    Routine Health Maintenance and Physical Exam  Immunization History  Administered Date(s) Administered   Hepatitis A 05/02/2013   Influenza,inj,Quad PF,6+ Mos 10/21/2018, 08/30/2020   Influenza-Unspecified 09/19/2017, 10/02/2019   PNEUMOCOCCAL CONJUGATE-20 08/04/2023    Health Maintenance  Topic Date Due   Medicare Annual Wellness (AWV)  Never done   DTaP/Tdap/Td (1 - Tdap) Never done   Zoster Vaccines- Shingrix (1 of 2) Never done   COVID-19 Vaccine (1 - 2024-25 season) Never done   INFLUENZA VACCINE  07/20/2024   Lung Cancer Screening  09/28/2024   Colonoscopy  11/30/2027   Pneumococcal Vaccine: 50+ Years  Completed   Hepatitis C Screening  Completed   HPV VACCINES  Aged Out   Meningococcal B Vaccine  Aged Out   Hepatitis B Vaccines 19-59 Average Risk  Discontinued    Discussed health benefits of physical activity, and encouraged him to engage in regular exercise appropriate for his age and condition.  Problem List Items Addressed This Visit       Other   B12 deficiency   Relevant Orders   B12 and Folate Panel (Completed)   Folate deficiency   Relevant  Orders   B12 and Folate Panel (Completed)   Vitamin D  deficiency   Relevant Orders   Vitamin D  (25 hydroxy) (Completed)   Prediabetes   Relevant Orders   CMP14+EGFR (Completed)   HgB A1c (Completed)   Other Visit Diagnoses       Encounter for preventative adult health care examination    -  Primary   Relevant Orders   CMP14+EGFR (Completed)   Lipid Profile (Completed)  CBC (Completed)     Prostate cancer screening       Relevant Orders   PSA (Completed)      No follow-ups on file. Assessment and Plan    Chronic kidney disease stage 3A Chronic kidney disease stage 3A with elevated creatinine and low GFR. Awaiting kidney ultrasound results. Ongoing discussion with nephrologist Dr. Rachele. Considered lisinopril  for kidney function and blood pressure control. - Await kidney ultrasound results. - Continue follow-up with nephrologist Dr. Rachele. - Consider starting lisinopril  for kidney function and blood pressure.  Tobacco use disorder Ongoing tobacco use disorder. Using nicotine gum. Previous success with hypnosis. Discussed potential side effects of Chantix. - Continue nicotine gum for smoking cessation. - Consider hypnosis as a cessation aid.  Blurred vision and pruritus Intermittent blurred vision and pruritus improved with zinc supplementation. Possible correlation with kidney issues affecting zinc levels. - Monitor symptoms and continue zinc supplementation as needed.          Leita Longs, FNP

## 2024-08-11 LAB — CMP14+EGFR
ALT: 16 IU/L (ref 0–44)
AST: 23 IU/L (ref 0–40)
Albumin: 4.5 g/dL (ref 3.9–4.9)
Alkaline Phosphatase: 88 IU/L (ref 44–121)
BUN/Creatinine Ratio: 13 (ref 10–24)
BUN: 18 mg/dL (ref 8–27)
Bilirubin Total: 2 mg/dL — ABNORMAL HIGH (ref 0.0–1.2)
CO2: 21 mmol/L (ref 20–29)
Calcium: 10.1 mg/dL (ref 8.6–10.2)
Chloride: 100 mmol/L (ref 96–106)
Creatinine, Ser: 1.37 mg/dL — ABNORMAL HIGH (ref 0.76–1.27)
Globulin, Total: 2.5 g/dL (ref 1.5–4.5)
Glucose: 91 mg/dL (ref 70–99)
Potassium: 4.3 mmol/L (ref 3.5–5.2)
Sodium: 137 mmol/L (ref 134–144)
Total Protein: 7 g/dL (ref 6.0–8.5)
eGFR: 57 mL/min/1.73 — ABNORMAL LOW (ref >59)

## 2024-08-11 LAB — B12 AND FOLATE PANEL
Folate: 18.7 ng/mL (ref >3.0)
Vitamin B-12: 780 pg/mL (ref 232–1245)

## 2024-08-11 LAB — HEMOGLOBIN A1C
Est. average glucose Bld gHb Est-mCnc: 114 mg/dL
Hgb A1c MFr Bld: 5.6 % (ref 4.8–5.6)

## 2024-08-11 LAB — LIPID PANEL
Chol/HDL Ratio: 3.7 ratio (ref 0.0–5.0)
Cholesterol, Total: 155 mg/dL (ref 100–199)
HDL: 42 mg/dL (ref >39)
LDL Chol Calc (NIH): 87 mg/dL (ref 0–99)
Triglycerides: 151 mg/dL — ABNORMAL HIGH (ref 0–149)
VLDL Cholesterol Cal: 26 mg/dL (ref 5–40)

## 2024-08-11 LAB — CBC
Hematocrit: 50.6 % (ref 37.5–51.0)
Hemoglobin: 16.9 g/dL (ref 13.0–17.7)
MCH: 33.7 pg — ABNORMAL HIGH (ref 26.6–33.0)
MCHC: 33.4 g/dL (ref 31.5–35.7)
MCV: 101 fL — ABNORMAL HIGH (ref 79–97)
Platelets: 217 x10E3/uL (ref 150–450)
RBC: 5.02 x10E6/uL (ref 4.14–5.80)
RDW: 13.2 % (ref 11.6–15.4)
WBC: 10 x10E3/uL (ref 3.4–10.8)

## 2024-08-11 LAB — VITAMIN D 25 HYDROXY (VIT D DEFICIENCY, FRACTURES): Vit D, 25-Hydroxy: 43.6 ng/mL (ref 30.0–100.0)

## 2024-08-11 LAB — PSA: Prostate Specific Ag, Serum: 3.9 ng/mL (ref 0.0–4.0)

## 2024-08-14 ENCOUNTER — Ambulatory Visit

## 2024-08-15 ENCOUNTER — Ambulatory Visit: Payer: Self-pay

## 2024-09-20 NOTE — Telephone Encounter (Signed)
 Kevin Davis: patient ready to schedule his u/s with elastography, he prefers afternoons. Dx: h/o chronic inactive Hep B, hepatic steatosis.    Ladonna: please schedule ov with Dr. Shaaron, he is having some diarrhea/nauseas concerns and follow up liver issues

## 2024-09-27 ENCOUNTER — Ambulatory Visit (HOSPITAL_COMMUNITY)
Admission: RE | Admit: 2024-09-27 | Discharge: 2024-09-27 | Disposition: A | Discharge status: Home / Self Care | Source: Ambulatory Visit | Attending: Gastroenterology | Admitting: Gastroenterology

## 2024-09-27 DIAGNOSIS — B181 Chronic viral hepatitis B without delta-agent: Secondary | ICD-10-CM | POA: Diagnosis present

## 2024-09-27 DIAGNOSIS — F1010 Alcohol abuse, uncomplicated: Secondary | ICD-10-CM | POA: Insufficient documentation

## 2024-10-04 ENCOUNTER — Ambulatory Visit: Payer: Self-pay | Admitting: Gastroenterology

## 2024-10-09 NOTE — Telephone Encounter (Signed)
 Message sent to patient attached to u/s with elastography results.

## 2024-10-15 ENCOUNTER — Telehealth: Payer: Self-pay | Admitting: Gastroenterology

## 2024-10-15 NOTE — Telephone Encounter (Signed)
 Lorn, can you please fax copy of the 09/27/2024 US  Abd complete w/elastography report to Dr. Rachele. There is an addendum on the report that ties it with his US  renal report from 08/02/24.

## 2024-10-16 ENCOUNTER — Ambulatory Visit (INDEPENDENT_AMBULATORY_CARE_PROVIDER_SITE_OTHER): Admitting: Internal Medicine

## 2024-10-16 ENCOUNTER — Encounter: Payer: Self-pay | Admitting: Internal Medicine

## 2024-10-16 VITALS — BP 132/84 | HR 47 | Temp 97.6°F | Ht 71.0 in | Wt 221.4 lb

## 2024-10-16 DIAGNOSIS — B181 Chronic viral hepatitis B without delta-agent: Secondary | ICD-10-CM

## 2024-10-16 DIAGNOSIS — Z860100 Personal history of colon polyps, unspecified: Secondary | ICD-10-CM | POA: Diagnosis not present

## 2024-10-16 NOTE — Patient Instructions (Signed)
 Good to see you again today  We will check an ultrasound of your liver every 6 months  LFTs and office visit in 6 months  Congratulations on smoking cessation.  Keep up the good work -  nicotine orally may be contributing to your loose bowels  Would also reach out to your PCP to help with getting third-party assistance with alcohol abstinence.  Use Imodium as needed hopefully your bowel function will settle out, if it does not let us  know  Plan for a follow-up colonoscopy in 2026  If you develop any interim problems, please let me know.

## 2024-10-16 NOTE — Progress Notes (Unsigned)
 Gastroenterology Progress Note    Primary Care Physician:  Bevely Doffing, FNP Primary Gastroenterologist:  Dr. Shaaron  Pre-Procedure History & Physical: HPI:  Kevin Davis is a 68 y.o. male here for inactive hepatitis B.  He continues to consume alcohol in excess.  He notes he can stop for several weeks at a time and does so about once a year. Recent elastography (high-quality) actually show improvement in his P KA of 2.1-improved from 2.7.  Also, incidentally found was an enlarging complex renal cyst for which she has been referred back to Dr. Rachele.  Patient tells me that he is being referred to urology and an MRI is to be ordered. He reports no more itching on zinc supplementation.  His aminotransferases have been normal.  Previously seen by infectious disease and Dr. Garnette Reek, hepatologist at [facility]. Although he has no evidence of advanced chronic liver disease he did have some morphologic changes including nodularity on prior ultrasound. He has stopped smoking now for several months and is using Nicorette gum.  He seems to think Nicorette gives him loose stools from time to time they are not always loose.  History of colonic adenomas; due for surveillance colonoscopy next year. Past Medical History:  Diagnosis Date   Anxiety    Basal cell carcinoma 2016   Basal cell carcinoma and dysplastic nevi removed same day, 1- lower abd , 1- upper L arm   Chronic kidney disease    Depression    Dysplastic nevus    Hepatitis B     Past Surgical History:  Procedure Laterality Date   COLON SURGERY  colonoscopy   COLONOSCOPY N/A 05/26/2015   surgeon:  Lamar CHRISTELLA Shaaron, MD;  [facility];  Service: Endoscopy;  Laterality: N/A;  945   COLONOSCOPY WITH PROPOFOL  N/A 11/29/2022   Procedure: COLONOSCOPY WITH PROPOFOL ;  Surgeon: Shaaron Lamar CHRISTELLA, MD;  [facility]: [facility];  Service: Endoscopy;  Laterality: N/A;  8:00 am   POLYPECTOMY  11/29/2022   Procedure: POLYPECTOMY;   Surgeon: Shaaron Lamar CHRISTELLA, MD;  [facility]: [facility];  Service: Endoscopy;;   SKIN BIOPSY      Prior to Admission medications   Medication Sig Start Date End Date Taking? Authorizing Provider  Cholecalciferol (VITAMIN D -3) 125 MCG (5000 UT) TABS Take 1 tablet by mouth daily.   Yes [provider]  cyanocobalamin  (VITAMIN B12) 1000 MCG tablet Take 1,000 mcg by mouth daily.   Yes [provider]  folic acid  (FOLVITE ) 1 MG tablet Take 1 tablet (1 mg total) by mouth daily. 08/05/23  Yes Melvenia Manus BRAVO, MD  zinc gluconate 50 MG tablet Take 25 mg by mouth daily.   Yes [provider]    Allergies as of 10/16/2024   (No Known Allergies)    Family History  Problem Relation Age of Onset   Cancer Mother        breast cancer   Varicose Veins Mother    Alcohol abuse Father    Depression Father    Early death Father    Acute myelogenous leukemia Sister    Hepatitis B Sister    Depression Sister    Early death Sister    Stroke Maternal Grandfather    Alcohol abuse Maternal Grandfather    Pancreatic cancer Paternal Grandfather        died age 9   Alcohol abuse Paternal Grandfather    Lymphoma Cousin    Cancer Maternal Grandmother  COPD Maternal Grandmother    Varicose Veins Maternal Grandmother    Diabetes Maternal Uncle    Diabetes Maternal Aunt    Colon cancer Neg Hx     Social History   Socioeconomic History   Marital status: Single    Spouse name: Not on file   Number of children: Not on file   Years of education: Not on file   Highest education level: Bachelor's degree (e.g., BA, AB, BS)  Occupational History   Occupation: unemployed  Tobacco Use   Smoking status: Every Day    Current packs/day: 1.50    Average packs/day: 1.7 packs/day for 48.8 years (82.1 ttl pk-yrs)    Types: Cigarettes   Smokeless tobacco: Never  Vaping Use   Vaping status: Never Used  Substance and Sexual Activity   Alcohol use: Yes    Alcohol/week: 10.0  standard drinks of alcohol    Types: 10 Standard drinks or equivalent per week    Comment: pint and a half a week   Drug use: No   Sexual activity: Yes    Partners: Female    Birth control/protection: Coitus interruptus  Other Topics Concern   Not on file  Social History Narrative   Not on file   Social Drivers of Health   Financial Resource Strain: Low Risk  (08/09/2024)   Overall Financial Resource Strain (CARDIA)    Difficulty of Paying Living Expenses: Not very hard  Food Insecurity: No Food Insecurity (08/09/2024)   Hunger Vital Sign    Worried About Running Out of Food in the Last Year: Never true    Ran Out of Food in the Last Year: Never true  Transportation Needs: No Transportation Needs (08/09/2024)   PRAPARE - Administrator, Civil Service (Medical): No    Lack of Transportation (Non-Medical): No  Physical Activity: Insufficiently Active (08/09/2024)   Exercise Vital Sign    Days of Exercise per Week: 3 days    Minutes of Exercise per Session: 40 min  Stress: Stress Concern Present (08/09/2024)   [employer] of Occupational Health - Occupational Stress Questionnaire    Feeling of Stress: To some extent  Social Connections: Socially Isolated (08/09/2024)   Social Connection and Isolation Panel    Frequency of Communication with Friends and Family: Once a week    Frequency of Social Gatherings with Friends and Family: Once a week    Attends Religious Services: 1 to 4 times per year    Active Member of [employer] or Organizations: No    Attends Engineer, Structural: Not on file    Marital Status: Never married  Catering Manager Violence: Not on file    Review of Systems   See HPI, otherwise negative ROS  Physical Exam: BP 132/84 (BP Location: Right Arm, Patient Position: Sitting, Cuff Size: Large)   Pulse (!) 47   Temp 97.6 F (36.4 C) (Oral)   Ht 5' 11 (1.803 m)   Wt 221 lb 6.4 oz (100.4 kg)   SpO2 98%   BMI 30.88 kg/m  General:    Alert,  Well-developed, well-nourished, pleasant and cooperative in NAD.  Lungs:  Clear throughout to auscultation.   No wheezes, crackles, or rhonchi. No acute distress. Heart:  Regular rate and rhythm; no murmurs, clicks, rubs,  or gallops. Abdomen: Non-distended, normal bowel sounds.  Soft and nontender without appreciable mass or hepatosplenomegaly.  Pulses:  Normal pulses noted. Extremities:  Without clubbing or edema.   Impression/Plan:  Impression: 68 year old gentleman with active alcohol use disorder, chronic inactive hepatitis B reports back to the clinic today.  Transaminases normal.  Isolated elevated bilirubin of 2.0 nonspecific and likely more related to ongoing alcohol consumption.  All in all, from a liver standpoint he is doing well.  Even though he has inactive chronic hepatitis B and appears not to have advanced chronic liver disease, concomitant heavy alcohol use puts him at risk for progressive liver disease.  I deem him at increased risk for complications including development of HCC. Long conversation about alcohol cessation today.  He was congratulated on smoking cessation.  Oral nicotine may be contributing to intermittent diarrhea.  History of colonic adenoma; due for surveillance colonoscopy in 2026.  Recommendations:  ultrasound of liver every 6 months  LFTs (with bilirubin fractionation ) and office visit in 6 months  Congratulations on smoking cessation.  Keep up the good work  Would also reach out to  PCP to help with  third-party assistance with alcohol abstinence.  Use Imodium for occasional diarrhea.  Let me know if becomes a persisting problem.  Surveillance colonoscopy 2026.  If you develop any interim problems, please let me know.    Notice: This dictation was prepared with Dragon dictation along with smaller phrase technology. Any transcriptional errors that result from this process are unintentional and may not be corrected upon review.

## 2024-10-18 ENCOUNTER — Telehealth: Payer: Self-pay | Admitting: Internal Medicine

## 2024-10-18 NOTE — Telephone Encounter (Signed)
 Noted and discussed with Dr. Shaaron earlier today.

## 2024-10-18 NOTE — Telephone Encounter (Signed)
 Noted

## 2024-10-18 NOTE — Telephone Encounter (Signed)
 I called patient today([phone]) I multiple times in reference to his MyChart message to us  to address his concerns in his most recent message.  The calls rolled to voicemail.  I elected not to leave a voice message as direct conversation would be most ideal in this situation. He reported concerns about Sonny Kerns, PA not present 2 days ago to look at his new mole.  I was the physician that saw him in the office 2 days ago.  Dermatological issues were not discussed.  He has a relationship with Dr. Marinell Hurst, the dermatologist.  It is my recommendation for the patient to go to Dr. Hurst or his PCP for further evaluation of skin issues as dermatological issues are not within the scope of our practice. In addition, on a recent elastography he was noted he had a new enlarging complex cystic lesion in his kidney.  This information was passed back to Dr. Rachele.  I recommended he ultimately needs to see a urologist.  MRI and urology consult in the works.   As far as liver issues are concerned all the bases were covered at his office visit the other day. Follow-up with us  as outlined in our most recent office note.

## 2024-10-22 ENCOUNTER — Ambulatory Visit (INDEPENDENT_AMBULATORY_CARE_PROVIDER_SITE_OTHER): Admitting: Urology

## 2024-10-22 VITALS — BP 145/68 | HR 57

## 2024-10-22 DIAGNOSIS — N2889 Other specified disorders of kidney and ureter: Secondary | ICD-10-CM

## 2024-10-22 DIAGNOSIS — N400 Enlarged prostate without lower urinary tract symptoms: Secondary | ICD-10-CM | POA: Diagnosis not present

## 2024-10-22 DIAGNOSIS — N281 Cyst of kidney, acquired: Secondary | ICD-10-CM | POA: Diagnosis not present

## 2024-10-22 DIAGNOSIS — R3915 Urgency of urination: Secondary | ICD-10-CM

## 2024-10-22 DIAGNOSIS — N138 Other obstructive and reflux uropathy: Secondary | ICD-10-CM

## 2024-10-22 LAB — URINALYSIS, ROUTINE W REFLEX MICROSCOPIC
Bilirubin, UA: NEGATIVE
Glucose, UA: NEGATIVE
Leukocytes,UA: NEGATIVE
Nitrite, UA: NEGATIVE
Specific Gravity, UA: 1.025 (ref 1.005–1.030)
Urobilinogen, Ur: 1 mg/dL (ref 0.2–1.0)
pH, UA: 5.5 (ref 5.0–7.5)

## 2024-10-22 LAB — MICROSCOPIC EXAMINATION
Bacteria, UA: NONE SEEN
Epithelial Cells (non renal): NONE SEEN /HPF (ref 0–10)
WBC, UA: NONE SEEN /HPF (ref 0–5)

## 2024-10-22 NOTE — Progress Notes (Unsigned)
 10/22/2024 1:45 PM   Kevin Davis [birth year] [id number]  Referring provider: Delsie Riggs, NP [address] [place],  [place] [address]  No chief complaint on file.   HPI:  New patient-  1) LLP renal cyst - pt with CKD III -  aug 2025 cr 1.37. Aug 2025 renal US  - normal. No cyst seen. Oct 2025 US  abd - Lobulated hypoechogenicity arising from the lower pole left kidney measures 4.1 x 2.1 cm, likely cyst, with internal septation. Lower pole left renal cyst is not seen on most recent prior examination dated 08/02/2024, however when correlated with more remote studies, a smaller cyst is noted in the lower pole left kidney on 07/27/2021. The cyst on the current examination has increased in size and complexity. MRI abdomen for further evaluation is again recommended.  2) BPH- aug 2025 psa 3.9. Feb 2025 PSA 2.5.   Today, seen for the above. His IPSS 7. He had urgency after covid. It settled down.   He did work for [employer].   PMH: Past Medical History:  Diagnosis Date   Anxiety    Basal cell carcinoma 2016   Basal cell carcinoma and dysplastic nevi removed same day, 1- lower abd , 1- upper L arm   Chronic kidney disease    Depression    Dysplastic nevus    Hepatitis B     Surgical History: Past Surgical History:  Procedure Laterality Date   COLON SURGERY  colonoscopy   COLONOSCOPY N/A 05/26/2015   surgeon:  Lamar CHRISTELLA Hollingshead, MD;  [facility];  Service: Endoscopy;  Laterality: N/A;  945   COLONOSCOPY WITH PROPOFOL  N/A 11/29/2022   Procedure: COLONOSCOPY WITH PROPOFOL ;  Surgeon: Hollingshead Lamar CHRISTELLA, MD;  [facility];  Service: Endoscopy;  Laterality: N/A;  8:00 am   POLYPECTOMY  11/29/2022   Procedure: POLYPECTOMY;  Surgeon: Hollingshead Lamar CHRISTELLA, MD;  [facility];  Service: Endoscopy;;   SKIN BIOPSY      Home Medications:  Allergies as of 10/22/2024   No Known Allergies      Medication List        Accurate as of October 22, 2024   1:45 PM. If you have any questions, ask your nurse or doctor.          cyanocobalamin  1000 MCG tablet Commonly known as: VITAMIN B12 Take 1,000 mcg by mouth daily.   folic acid  1 MG tablet Commonly known as: FOLVITE  Take 1 tablet (1 mg total) by mouth daily.   Vitamin D -3 125 MCG (5000 UT) Tabs Take 1 tablet by mouth daily.   zinc gluconate 50 MG tablet Take 25 mg by mouth daily.        Allergies: No Known Allergies  Family History: Family History  Problem Relation Age of Onset   Cancer Mother        breast cancer   Varicose Veins Mother    Alcohol abuse Father    Depression Father    Early death Father    Acute myelogenous leukemia Sister    Hepatitis B Sister    Depression Sister    Early death Sister    Stroke Maternal Grandfather    Alcohol abuse Maternal Grandfather    Pancreatic cancer Paternal Grandfather        died age 99   Alcohol abuse Paternal Grandfather    Lymphoma Cousin    Cancer Maternal Grandmother    COPD Maternal Grandmother    Varicose  Veins Maternal Grandmother    Diabetes Maternal Uncle    Diabetes Maternal Aunt    Colon cancer Neg Hx     Social History:  reports that he has been smoking cigarettes. He has a 82.1 pack-year smoking history. He has never used smokeless tobacco. He reports current alcohol use of about 10.0 standard drinks of alcohol per week. He reports that he does not use drugs.   Physical Exam: BP (!) 145/68   Pulse (!) 57   Constitutional:  Alert and oriented, No acute distress. HEENT: [facility] AT, moist mucus membranes.  Trachea midline, no masses. Cardiovascular: No clubbing, cyanosis, or edema. Respiratory: Normal respiratory effort, no increased work of breathing. GI: Abdomen is soft, nontender, nondistended, no abdominal masses GU: No CVA tenderness Skin: No rashes, bruises or suspicious lesions. Neurologic: Grossly intact, no focal deficits, moving all 4 extremities. Psychiatric: Normal mood and  affect.  Laboratory Data: Lab Results  Component Value Date   WBC 10.0 08/10/2024   HGB 16.9 08/10/2024   HCT 50.6 08/10/2024   MCV 101 (H) 08/10/2024   PLT 217 08/10/2024    Lab Results  Component Value Date   CREATININE 1.37 (H) 08/10/2024    Lab Results  Component Value Date   PSA 1.8 02/23/2017    No results found for: TESTOSTERONE  Lab Results  Component Value Date   HGBA1C 5.6 08/10/2024    Urinalysis    Component Value Date/Time   COLORURINE YELLOW 07/21/2021 1545   APPEARANCEUR CLEAR 07/21/2021 1545   LABSPEC 1.020 07/21/2021 1545   PHURINE 5.0 07/21/2021 1545   GLUCOSEU NEGATIVE 07/21/2021 1545   HGBUR NEGATIVE 07/21/2021 1545   BILIRUBINUR SMALL 11/03/2022 1403   KETONESUR NEGATIVE 07/21/2021 1545   PROTEINUR Positive (A) 11/03/2022 1403   PROTEINUR NEGATIVE 07/21/2021 1545   UROBILINOGEN 0.2 11/03/2022 1403   NITRITE NEG 11/03/2022 1403   NITRITE NEGATIVE 07/21/2021 1545   LEUKOCYTESUR Negative 11/03/2022 1403   LEUKOCYTESUR NEGATIVE 07/21/2021 1545    No results found for: LABMICR, WBCUA, RBCUA, LABEPIT, MUCUS, BACTERIA  Pertinent Imaging:   Results for orders placed during the hospital encounter of 08/02/24  US  RENAL  Narrative CLINICAL DATA:  Stage IIIA chronic kidney disease  EXAM: RENAL / URINARY TRACT ULTRASOUND COMPLETE  COMPARISON:  MRI abdomen 10/30/2009  FINDINGS: Right Kidney:  Renal measurements: 10.9 x 4.8 x 5.7 cm = volume: 162 mL. Echogenicity within normal limits. No mass or hydronephrosis visualized.  Left Kidney:  Renal measurements: 10.2 x 5.4 x 5.1 cm = volume: 150 mL. Echogenicity within normal limits. No mass or hydronephrosis visualized.  Bladder:  Appears normal for degree of bladder distention. Prevoid bladder volume measures 23 mL. No significant postvoid residual, measured at 2 mL.  Other:  None.  IMPRESSION: Normal ultrasound appearance of the kidneys and bladder.  No hydronephrosis.   Electronically Signed By: Elsie Gravely M.D. On: 08/16/2024 20:51  No results found for this or any previous visit.  No results found for this or any previous visit.  No results found for this or any previous visit.   Assessment & Plan:    1. Renal cyst (Primary) Discussed the nature of cysts and complexity can increase the risk of malignancy.  Discussed the nature risk and benefits of an MRI with contrast and he will proceed. - Urinalysis, Routine w reflex microscopic; Future  2.  BPH-continue surveillance. Needs a PSA by FEB 2026.    No follow-ups on file.  Donnice Brooks, MD  [facility] [place]  [address] [place], [place] [address] [phone]

## 2024-10-25 ENCOUNTER — Telehealth: Payer: Self-pay | Admitting: *Deleted

## 2024-10-25 ENCOUNTER — Ambulatory Visit (HOSPITAL_COMMUNITY)
Admission: RE | Admit: 2024-10-25 | Discharge: 2024-10-25 | Disposition: A | Discharge status: Home / Self Care | Source: Ambulatory Visit | Attending: Urology | Admitting: Urology

## 2024-10-25 DIAGNOSIS — N2889 Other specified disorders of kidney and ureter: Secondary | ICD-10-CM | POA: Insufficient documentation

## 2024-10-25 MED ORDER — GADOBUTROL 1 MMOL/ML IV SOLN
10.0000 mL | Freq: Once | INTRAVENOUS | Status: AC | PRN
  Administered 2024-10-25: 10 mL via INTRAVENOUS

## 2024-10-25 NOTE — Telephone Encounter (Signed)
 3:02 PM called patient --directly to VM will try back.  Probably on another call.

## 2024-10-25 NOTE — Telephone Encounter (Signed)
 3:53 PM tried to call patient once again --directly to voice mail.  Will try again before leaving for today.

## 2024-10-26 ENCOUNTER — Ambulatory Visit: Payer: Self-pay

## 2024-10-26 NOTE — Telephone Encounter (Signed)
 FYI

## 2024-10-30 ENCOUNTER — Encounter (INDEPENDENT_AMBULATORY_CARE_PROVIDER_SITE_OTHER): Payer: Self-pay | Admitting: *Deleted

## 2024-10-30 NOTE — Telephone Encounter (Signed)
 9:44 AM tried once again to contact patient and it again does not ring but goes directly to voice mail.    This has been the third attempt to call patient to discuss the issues and I do not feel Mychart is the correct platform to talk about this situation.

## 2024-10-31 NOTE — Telephone Encounter (Signed)
 Please advise pt

## 2024-11-24 ENCOUNTER — Telehealth: Payer: Self-pay | Admitting: Internal Medicine

## 2024-11-24 NOTE — Telephone Encounter (Signed)
 Patient's highly inappropriate personal comments made to my staff in his MyChart messages remain most concerning.  I have had multiple  conversations with the my site director and staff members regarding patient's behavior.  Both myself and my site director, in good faith, have reached out to patient to rectify the situation short of patient discharge. We have been unsuccessful in reaching the patient. Because of difficulties in communication and inappropriate communication on the part of the patient, we now have a non-productive doctor-patient relationship.  The patient will be discharged from the practice.

## 2024-11-26 ENCOUNTER — Encounter: Payer: Self-pay | Admitting: Internal Medicine

## 2024-11-26 NOTE — Telephone Encounter (Signed)
 noted

## 2024-11-26 NOTE — Telephone Encounter (Signed)
 Noted.   Will route to [place] for FYI.

## 2024-11-26 NOTE — Telephone Encounter (Signed)
 Noted

## 2024-12-06 ENCOUNTER — Ambulatory Visit

## 2024-12-06 VITALS — BP 128/70 | HR 51 | Ht 72.0 in | Wt 220.0 lb

## 2024-12-06 DIAGNOSIS — F17210 Nicotine dependence, cigarettes, uncomplicated: Secondary | ICD-10-CM | POA: Diagnosis not present

## 2024-12-06 DIAGNOSIS — Z0001 Encounter for general adult medical examination with abnormal findings: Secondary | ICD-10-CM | POA: Diagnosis not present

## 2024-12-06 DIAGNOSIS — Z0000 Encounter for general adult medical examination without abnormal findings: Secondary | ICD-10-CM

## 2024-12-06 NOTE — Progress Notes (Signed)
 HM Addressed: Referral sent for Low Dose Chest CT (smoker/hx smoking)  Chief Complaint  Patient presents with   Medicare Wellness     Subjective:   Kevin Davis is a 68 y.o. male who presents for a Medicare Annual Wellness Visit.  Visit info / Clinical Intake: Medicare Wellness Visit Type:: Initial Annual Wellness Visit Persons participating in visit and providing information:: patient Medicare Wellness Visit Mode:: Telephone If telephone:: video declined Since this visit was completed virtually, some vitals may be partially provided or unavailable. Missing vitals are due to the limitations of the virtual format.: Documented vitals are patient reported If Telephone or Video please confirm:: I connected with patient using audio/video enable telemedicine. I verified patient identity with two identifiers, discussed telehealth limitations, and patient agreed to proceed. Patient Location:: home Provider Location:: home office Interpreter Needed?: No Pre-visit prep was completed: yes AWV questionnaire completed by patient prior to visit?: yes Date:: 11/30/24 Living arrangements:: (Patient-Rptd) with family/others Patient's Overall Health Status Rating: (Patient-Rptd) good Typical amount of pain: (Patient-Rptd) some Does pain affect daily life?: (!) (Patient-Rptd) yes Are you currently prescribed opioids?: no  Dietary Habits and Nutritional Risks How many meals a day?: (Patient-Rptd) 3 Eats fruit and vegetables daily?: (Patient-Rptd) yes Most meals are obtained by: (Patient-Rptd) preparing own meals In the last 2 weeks, have you had any of the following?: none Diabetic:: no  Functional Status Activities of Daily Living (to include ambulation/medication): (Patient-Rptd) Independent Ambulation: (Patient-Rptd) Independent Medication Administration: (Patient-Rptd) Independent Home Management (perform basic housework or laundry): (Patient-Rptd) Independent Manage your own  finances?: (Patient-Rptd) yes Primary transportation is: (Patient-Rptd) driving Concerns about vision?: no *vision screening is required for WTM* Concerns about hearing?: no  Fall Screening Falls in the past year?: (Patient-Rptd) 0 Number of falls in past year: 0 Was there an injury with Fall?: 0 Fall Risk Category Calculator: 0 Patient Fall Risk Level: Low Fall Risk  Fall Risk Patient at Risk for Falls Due to: No Fall Risks Fall risk Follow up: Falls evaluation completed; Education provided; Falls prevention discussed  Home and Transportation Safety: All rugs have non-skid backing?: (!) (Patient-Rptd) no All stairs or steps have railings?: (Patient-Rptd) yes Grab bars in the bathtub or shower?: (!) (Patient-Rptd) no Have non-skid surface in bathtub or shower?: (!) (Patient-Rptd) no Good home lighting?: (Patient-Rptd) yes Regular seat belt use?: (Patient-Rptd) yes Hospital stays in the last year:: (Patient-Rptd) no  Cognitive Assessment Difficulty concentrating, remembering, or making decisions? : (Patient-Rptd) no Will 6CIT or Mini Cog be Completed: yes What year is it?: 0 points What month is it?: 0 points Give patient an address phrase to remember (5 components): [address] [place] About what time is it?: 0 points Count backwards from 20 to 1: 0 points Say the months of the year in reverse: 0 points Repeat the address phrase from earlier: 0 points 6 CIT Score: 0 points  Advance Directives (For Healthcare) Does Patient Have a Medical Advance Directive?: No Would patient like information on creating a medical advance directive?: No - Patient declined  Reviewed/Updated  Reviewed/Updated: Reviewed All (Medical, Surgical, Family, Medications, Allergies, Care Teams, Patient Goals)    Allergies (verified) Patient has no known allergies.   Current Medications (verified) Outpatient Encounter Medications as of 12/06/2024  Medication Sig   [DISCONTINUED]  Cholecalciferol (VITAMIN D -3) 125 MCG (5000 UT) TABS Take 1 tablet by mouth daily. (Patient not taking: Reported on 10/22/2024)   [DISCONTINUED] cyanocobalamin  (VITAMIN B12) 1000 MCG tablet Take 1,000 mcg by  mouth daily. (Patient not taking: Reported on 10/22/2024)   [DISCONTINUED] folic acid  (FOLVITE ) 1 MG tablet Take 1 tablet (1 mg total) by mouth daily. (Patient not taking: Reported on 10/22/2024)   [DISCONTINUED] zinc gluconate 50 MG tablet Take 25 mg by mouth daily.   No facility-administered encounter medications on file as of 12/06/2024.    History: Past Medical History:  Diagnosis Date   Anxiety    Basal cell carcinoma 2016   Basal cell carcinoma and dysplastic nevi removed same day, 1- lower abd , 1- upper L arm   Chronic kidney disease    Depression    Dysplastic nevus    Hepatitis B    Past Surgical History:  Procedure Laterality Date   COLON SURGERY  colonoscopy   COLONOSCOPY N/A 05/26/2015   surgeon:  Lamar CHRISTELLA Hollingshead, MD;  Location: [facility];  Service: Endoscopy;  Laterality: N/A;  945   COLONOSCOPY WITH PROPOFOL  N/A 11/29/2022   Procedure: COLONOSCOPY WITH PROPOFOL ;  Surgeon: Hollingshead Lamar CHRISTELLA, MD;  Location: [facility];  Service: Endoscopy;  Laterality: N/A;  8:00 am   POLYPECTOMY  11/29/2022   Procedure: POLYPECTOMY;  Surgeon: Hollingshead Lamar CHRISTELLA, MD;  Location: [facility];  Service: Endoscopy;;   SKIN BIOPSY     Family History  Problem Relation Age of Onset   Cancer Mother        breast cancer   Varicose Veins Mother    Alcohol abuse Father    Depression Father    Early death Father    Acute myelogenous leukemia Sister    Hepatitis B Sister    Depression Sister    Early death Sister    Stroke Maternal Grandfather    Alcohol abuse Maternal Grandfather    Pancreatic cancer Paternal Grandfather        died age 74   Alcohol abuse Paternal Grandfather    Lymphoma Cousin    Cancer Maternal Grandmother    COPD Maternal Grandmother    Varicose Veins  Maternal Grandmother    Diabetes Maternal Uncle    Diabetes Maternal Aunt    Colon cancer Neg Hx    Social History   Occupational History   Occupation: unemployed  Tobacco Use   Smoking status: Former    Current packs/day: 0.00    Average packs/day: 2.0 packs/day for 41.6 years (83.2 ttl pk-yrs)    Types: Cigarettes    Start date: 73    Quit date: 07/20/2024    Years since quitting: 0.3   Smokeless tobacco: Never  Vaping Use   Vaping status: Never Used  Substance and Sexual Activity   Alcohol use: Yes    Alcohol/week: 10.0 standard drinks of alcohol    Types: 10 Standard drinks or equivalent per week    Comment: pint and a half a week   Drug use: No   Sexual activity: Yes    Partners: Female    Birth control/protection: Coitus interruptus   Tobacco Counseling Counseling given: Not Answered  SDOH Screenings   Food Insecurity: No Food Insecurity (11/30/2024)  Housing: Low Risk (11/30/2024)  Transportation Needs: No Transportation Needs (11/30/2024)  Utilities: Not At Risk (12/06/2024)  Alcohol Screen: Low Risk (11/30/2024)  Depression (PHQ2-9): Low Risk (12/06/2024)  Financial Resource Strain: Low Risk (11/30/2024)  Physical Activity: Sufficiently Active (11/30/2024)  Social Connections: Unknown (11/30/2024)  Stress: Stress Concern Present (11/30/2024)  Tobacco Use: Medium Risk (12/06/2024)  Health Literacy: Adequate Health Literacy (12/06/2024)   See flowsheets for full  screening details  Depression Screen PHQ 2 & 9 Depression Scale- Over the past 2 weeks, how often have you been bothered by any of the following problems? Little interest or pleasure in doing things: 0 Feeling down, depressed, or hopeless (PHQ Adolescent also includes...irritable): 0 PHQ-2 Total Score: 0 Trouble falling or staying asleep, or sleeping too much: 1 Feeling tired or having little energy: 1 Poor appetite or overeating (PHQ Adolescent also includes...weight loss): 0 Feeling bad about  yourself - or that you are a failure or have let yourself or your family down: 0 Trouble concentrating on things, such as reading the newspaper or watching television (PHQ Adolescent also includes...like school work): 0 Moving or speaking so slowly that other people could have noticed. Or the opposite - being so fidgety or restless that you have been moving around a lot more than usual: 0 Thoughts that you would be better off dead, or of hurting yourself in some way: 0 PHQ-9 Total Score: 2     Goals Addressed               This Visit's Progress     Lose at least 20 lbs (pt-stated)               Objective:    Today's Vitals   12/06/24 1531  BP: 128/70  Pulse: (!) 51  Weight: 220 lb (99.8 kg)  Height: 6' (1.829 m)   Body mass index is 29.84 kg/m.  Hearing/Vision screen Hearing Screening - Comments:: Patient denies any hearing difficulties.   Vision Screening - Comments:: Patient does not have an eye doctor. A list of eye doctors has been provided to the patient.   Immunizations and Health Maintenance Health Maintenance  Topic Date Due   Medicare Annual Wellness (AWV)  Never done   DTaP/Tdap/Td (1 - Tdap) Never done   Zoster Vaccines- Shingrix (1 of 2) Never done   Influenza Vaccine  07/20/2024   COVID-19 Vaccine (1 - 2025-26 season) Never done   Lung Cancer Screening  09/28/2024   Colonoscopy  11/30/2027   Pneumococcal Vaccine: 50+ Years  Completed   Hepatitis C Screening  Completed   Meningococcal B Vaccine  Aged Out   Hepatitis B Vaccines 19-59 Average Risk  Discontinued        Assessment/Plan:  This is a routine wellness examination for Joh.  Patient Care Team: Bevely Doffing, FNP as PCP - General (Family Medicine) Nieves Cough, MD as Consulting Physician (Urology) Ezzard Sonny GORMAN DEVONNA as Physician Assistant (Gastroenterology) Rachele Gaynell GORMAN, MD as Referring Physician (Nephrology)  I have personally reviewed and noted the following in the  patients chart:   Medical and social history Use of alcohol, tobacco or illicit drugs  Current medications and supplements including opioid prescriptions. Functional ability and status Nutritional status Physical activity Advanced directives List of other physicians Hospitalizations, surgeries, and ER visits in previous 12 months Vitals Screenings to include cognitive, depression, and falls Referrals and appointments  Orders Placed This Encounter  Procedures   Ambulatory Referral for Lung Cancer Scre    Referral Priority:   Routine    Referral Type:   Consultation    Referral Reason:   Specialty Services Required    Number of Visits Requested:   1   In addition, I have reviewed and discussed with patient certain preventive protocols, quality metrics, and best practice recommendations. A written personalized care plan for preventive services as well as general preventive health recommendations were provided to patient.  Kevin Davis, CMA   12/06/2024   Return on Monday December 09, 2025 at 3:10pm, for your yearly Medicare Wellness Visit in person.  After Visit Summary: (MyChart) Due to this being a telephonic visit, the after visit summary with patients personalized plan was offered to patient via MyChart

## 2024-12-06 NOTE — Patient Instructions (Signed)
 Kevin Davis,  Thank you for taking the time for your Medicare Wellness Visit. I appreciate your continued commitment to your health goals. Please review the care plan we discussed, and feel free to reach out if I can assist you further.  Please note that Annual Wellness Visits do not include a physical exam. Some assessments may be limited, especially if the visit was conducted virtually. If needed, we may recommend an in-person follow-up with your provider.  Ongoing Care Seeing your primary care provider every 3 to 6 months helps us  monitor your health and provide consistent, personalized care.   1 year follow up for Medicare well visit: Monday December 09, 2025 at 3:10pm with medicare wellness nurse in office  Referrals If a referral was made during today's visit and you haven't received any updates within two weeks, please contact the referred provider directly to check on the status.  Lung Cancer Screening-[place] Office [address]-First Floor Medical Building directly across from [facility] Phone [phone]   Recommended Screenings:  Health Maintenance  Topic Date Due   Medicare Annual Wellness Visit  Never done   DTaP/Tdap/Td vaccine (1 - Tdap) Never done   Zoster (Shingles) Vaccine (1 of 2) Never done   Flu Shot  07/20/2024   COVID-19 Vaccine (1 - 2025-26 season) Never done   Screening for Lung Cancer  09/28/2024   Colon Cancer Screening  11/30/2027   Pneumococcal Vaccine for age over 49  Completed   Hepatitis C Screening  Completed   Meningitis B Vaccine  Aged Out   Hepatitis B Vaccine  Discontinued       11/30/2024    2:34 PM  Advanced Directives  Does Patient Have a Medical Advance Directive? No  Would patient like information on creating a medical advance directive? No - Patient declined    Vision: Annual vision screenings are recommended for early detection of glaucoma, cataracts, and diabetic retinopathy. These exams can also reveal signs of  chronic conditions such as diabetes and high blood pressure.  Dental: Annual dental screenings help detect early signs of oral cancer, gum disease, and other conditions linked to overall health, including heart disease and diabetes.  Please see the attached documents for additional preventive care recommendations.

## 2024-12-07 ENCOUNTER — Other Ambulatory Visit: Payer: Self-pay

## 2024-12-07 DIAGNOSIS — Z122 Encounter for screening for malignant neoplasm of respiratory organs: Secondary | ICD-10-CM

## 2024-12-07 DIAGNOSIS — Z87891 Personal history of nicotine dependence: Secondary | ICD-10-CM

## 2025-02-08 ENCOUNTER — Ambulatory Visit

## 2025-02-25 ENCOUNTER — Other Ambulatory Visit

## 2025-03-04 ENCOUNTER — Ambulatory Visit: Admitting: Urology

## 2025-12-09 ENCOUNTER — Ambulatory Visit: Payer: Self-pay
